# Patient Record
Sex: Male | Born: 1960 | Race: White | Hispanic: No | Smoking: Former smoker
Health system: Southern US, Community
[De-identification: ages and names within clinical notes are randomized; demographics above are authoritative.]

## PROBLEM LIST (undated history)

## (undated) DIAGNOSIS — E785 Hyperlipidemia, unspecified: Secondary | ICD-10-CM

## (undated) DIAGNOSIS — J449 Chronic obstructive pulmonary disease, unspecified: Secondary | ICD-10-CM

## (undated) DIAGNOSIS — I2510 Atherosclerotic heart disease of native coronary artery without angina pectoris: Secondary | ICD-10-CM

## (undated) HISTORY — DX: Chronic obstructive pulmonary disease, unspecified: J44.9

## (undated) HISTORY — PX: CHOLECYSTECTOMY: SHX55

## (undated) HISTORY — DX: Hyperlipidemia, unspecified: E78.5

---

## 2010-01-11 ENCOUNTER — Ambulatory Visit (HOSPITAL_BASED_OUTPATIENT_CLINIC_OR_DEPARTMENT_OTHER): Admission: RE | Admit: 2010-01-11 | Discharge: 2010-01-11 | Payer: Self-pay | Admitting: Specialist

## 2010-05-21 LAB — POCT HEMOGLOBIN-HEMACUE: Hemoglobin: 16 g/dL (ref 13.0–17.0)

## 2013-08-13 ENCOUNTER — Encounter (HOSPITAL_COMMUNITY): Payer: Self-pay | Admitting: Emergency Medicine

## 2013-08-13 ENCOUNTER — Emergency Department (HOSPITAL_COMMUNITY)
Admission: EM | Admit: 2013-08-13 | Discharge: 2013-08-13 | Disposition: A | Discharge status: Home / Self Care | Payer: 59 | Attending: Emergency Medicine | Admitting: Emergency Medicine

## 2013-08-13 DIAGNOSIS — Z9089 Acquired absence of other organs: Secondary | ICD-10-CM | POA: Insufficient documentation

## 2013-08-13 DIAGNOSIS — Z8619 Personal history of other infectious and parasitic diseases: Secondary | ICD-10-CM | POA: Insufficient documentation

## 2013-08-13 DIAGNOSIS — R1013 Epigastric pain: Secondary | ICD-10-CM | POA: Insufficient documentation

## 2013-08-13 DIAGNOSIS — R1110 Vomiting, unspecified: Secondary | ICD-10-CM | POA: Insufficient documentation

## 2013-08-13 LAB — URINALYSIS, ROUTINE W REFLEX MICROSCOPIC
Bilirubin Urine: NEGATIVE
Glucose, UA: NEGATIVE mg/dL
Hgb urine dipstick: NEGATIVE
Ketones, ur: >80 mg/dL — AB
Leukocytes, UA: NEGATIVE
Nitrite: NEGATIVE
Protein, ur: 30 mg/dL — AB
Specific Gravity, Urine: 1.03 (ref 1.005–1.030)
Urobilinogen, UA: 0.2 mg/dL (ref 0.0–1.0)
pH: 8.5 — ABNORMAL HIGH (ref 5.0–8.0)

## 2013-08-13 LAB — COMPREHENSIVE METABOLIC PANEL
ALT: 24 U/L (ref 0–53)
AST: 20 U/L (ref 0–37)
Albumin: 4.4 g/dL (ref 3.5–5.2)
Alkaline Phosphatase: 114 U/L (ref 39–117)
BUN: 18 mg/dL (ref 6–23)
CO2: 25 mEq/L (ref 19–32)
Calcium: 10 mg/dL (ref 8.4–10.5)
Chloride: 95 mEq/L — ABNORMAL LOW (ref 96–112)
Creatinine, Ser: 0.99 mg/dL (ref 0.50–1.35)
GFR calc Af Amer: >90 mL/min (ref >90)
GFR calc non Af Amer: >90 mL/min (ref >90)
Glucose, Bld: 129 mg/dL — ABNORMAL HIGH (ref 70–99)
Potassium: 3.6 mEq/L — ABNORMAL LOW (ref 3.7–5.3)
Sodium: 136 mEq/L — ABNORMAL LOW (ref 137–147)
Total Bilirubin: 0.8 mg/dL (ref 0.3–1.2)
Total Protein: 7.6 g/dL (ref 6.0–8.3)

## 2013-08-13 LAB — CBC WITH DIFFERENTIAL/PLATELET
Basophils Absolute: 0 10*3/uL (ref 0.0–0.1)
Basophils Relative: 0 % (ref 0–1)
Eosinophils Absolute: 0 10*3/uL (ref 0.0–0.7)
Eosinophils Relative: 0 % (ref 0–5)
HCT: 44.3 % (ref 39.0–52.0)
Hemoglobin: 16.4 g/dL (ref 13.0–17.0)
Lymphocytes Relative: 12 % (ref 12–46)
Lymphs Abs: 1.8 10*3/uL (ref 0.7–4.0)
MCH: 34.4 pg — ABNORMAL HIGH (ref 26.0–34.0)
MCHC: 37 g/dL — ABNORMAL HIGH (ref 30.0–36.0)
MCV: 92.9 fL (ref 78.0–100.0)
Monocytes Absolute: 1.3 10*3/uL — ABNORMAL HIGH (ref 0.1–1.0)
Monocytes Relative: 9 % (ref 3–12)
Neutro Abs: 12.5 10*3/uL — ABNORMAL HIGH (ref 1.7–7.7)
Neutrophils Relative %: 80 % — ABNORMAL HIGH (ref 43–77)
Platelets: 309 10*3/uL (ref 150–400)
RBC: 4.77 MIL/uL (ref 4.22–5.81)
RDW: 12.2 % (ref 11.5–15.5)
WBC: 15.7 10*3/uL — ABNORMAL HIGH (ref 4.0–10.5)

## 2013-08-13 LAB — LIPASE, BLOOD: Lipase: 25 U/L (ref 11–59)

## 2013-08-13 LAB — URINE MICROSCOPIC-ADD ON

## 2013-08-13 MED ORDER — ONDANSETRON HCL 4 MG/2ML IJ SOLN
4.0000 mg | Freq: Once | INTRAMUSCULAR | Status: AC
  Administered 2013-08-13: 4 mg via INTRAVENOUS
  Filled 2013-08-13: qty 2

## 2013-08-13 MED ORDER — RANITIDINE HCL 150 MG PO CAPS: 150.0000 mg | ORAL_CAPSULE | Freq: Every day | ORAL | Status: DC

## 2013-08-13 MED ORDER — SUCRALFATE 1 G PO TABS: 1.0000 g | ORAL_TABLET | Freq: Three times a day (TID) | ORAL | Status: DC

## 2013-08-13 MED ORDER — SUCRALFATE 1 G PO TABS
1.0000 g | ORAL_TABLET | Freq: Once | ORAL | Status: AC
Start: 2013-08-13 — End: 2013-08-13
  Administered 2013-08-13: 1 g via ORAL
  Filled 2013-08-13 (×2): qty 1

## 2013-08-13 MED ORDER — ONDANSETRON HCL 4 MG PO TABS: 4.0000 mg | ORAL_TABLET | Freq: Three times a day (TID) | ORAL | Status: DC | PRN

## 2013-08-13 MED ORDER — PANTOPRAZOLE SODIUM 40 MG IV SOLR
40.0000 mg | Freq: Once | INTRAVENOUS | Status: AC
  Administered 2013-08-13: 40 mg via INTRAVENOUS
  Filled 2013-08-13: qty 40

## 2013-08-13 MED ORDER — SODIUM CHLORIDE 0.9 % IV SOLN
INTRAVENOUS | Status: DC
  Administered 2013-08-13: 14:00:00 via INTRAVENOUS

## 2013-08-13 MED ORDER — SODIUM CHLORIDE 0.9 % IV BOLUS (SEPSIS)
1000.0000 mL | Freq: Once | INTRAVENOUS | Status: AC
  Administered 2013-08-13: 1000 mL via INTRAVENOUS

## 2013-08-13 MED ORDER — GI COCKTAIL ~~LOC~~
30.0000 mL | Freq: Once | ORAL | Status: AC
  Administered 2013-08-13: 30 mL via ORAL
  Filled 2013-08-13: qty 30

## 2013-08-13 NOTE — ED Notes (Signed)
Initial Contact - pt to [facility] with family, changed to hospital gown.  Pt reports c/o chills and nausea, reports hx of similar, being worked up by GI for same episodes.  Pt denies vomiting, denies pain, denies other complaints.  Pt appears uncomfortable.  Skin PWD.  A+Ox4.  MAEI.  NAD.

## 2013-08-13 NOTE — ED Notes (Signed)
Pt given crackers and water, tolerating well at this time.

## 2013-08-13 NOTE — ED Provider Notes (Signed)
CSN: [id number]     Arrival date & time 08/13/13  1210 History   First MD Initiated Contact with Patient 08/13/13 1243     Chief Complaint  Patient presents with  . Emesis     (Consider location/radiation/quality/duration/timing/severity/associated sxs/prior Treatment) HPI  53 year old male with prior surgical history of cholecystectomy who presents complaining of vomiting. Patient reports he has had recurrent symptoms of vomiting date as far back 6 years ago. At that time he was diagnosed with having H. pylori, receiving treatment and was symptom-free for 5 years. States for the past several days he was very nauseous and and sick to his stomach. Yesterday he started to vomit with nonbloody nonbilious vomitus and quantify the past 5-10 times since. Reports feeling subjective fever, chills, diaphoresis, body shakes, and weak. He has had symptoms like this in the past which usually lasting for about 4 or 5 days and resolved on its own. He has followup with 2 separate GI specialist and also had his gallbladder removed last November however his symptoms returned. He denies having any appetite. No complaints of headache, chest pain, shortness of breath, back pain, dysuria, or rash. Denies any strenuous exercise any recent trauma. No history of alcohol abuse or diabetes. No significant cardiac disease. Patient unable to keep anything down at this time. Last BM was yesterday and it was normal.  Able to pass flatus.  History reviewed. No pertinent past medical history. Past Surgical History  Procedure Laterality Date  . Cholecystectomy     No family history on file. History  Substance Use Topics  . Smoking status: Never Smoker   . Smokeless tobacco: Not on file  . Alcohol Use: No    Review of Systems  All other systems reviewed and are negative.     Allergies  Review of patient's allergies indicates not on file.  Home Medications   Prior to Admission medications   Not on File   BP  159/99  Pulse 90  Temp(Src) 97.8 F (36.6 C) (Oral)  Resp 18  SpO2 100% Physical Exam  Constitutional: He appears well-developed and well-nourished. No distress.  HENT:  Head: Atraumatic.  Mouth/Throat: Oropharynx is clear and moist.  Eyes: Conjunctivae are normal.  Neck: Normal range of motion. Neck supple.  Cardiovascular: Normal rate and regular rhythm.   Pulmonary/Chest: Effort normal and breath sounds normal. No respiratory distress. He has no wheezes. He has no rales. He exhibits no tenderness.  Abdominal: Soft. There is tenderness (Mild epigastric tenderness without guarding or rebound tenderness. No Murphy sign, no pain at McBurney's point, abdomen is nondistended with bowel sounds.).  Neurological: He is alert.  Skin: No rash noted.  Psychiatric: He has a normal mood and affect.    ED Course  Procedures (including critical care time)  1:04 PM Patient presents with recurrent bouts of cyclical vomitus but recurred every 4 months. He has a benign abdomen on exam. Is afebrile with stable normal vital signs. He does appears mildly uncomfortable an and when I palpate his epigastric region. Workup initiated.  2:34 PM Urine showed greater than 80 ketones to suggest dehydration. No evidence to suggest urinary tract infection. He has an elevated white count with a WBC of 15.7. This is nonspecific and likely a reflection of his current status. Given that he has no right low abnormal pain to suggest appendicitis and no left lower quadrant abdominal pain to suggest colitis and his lipase are normal therefore I have no strong indication to obtain advanced imaging  such as a CT scan of his abdomen. He has a nonsurgical abdomen. He has had prior cholecystectomy. Patient felt better after receiving antinausea medication and IV fluid. I will continue treating his symptoms but I suspect patient will be able to followup with his GI specialist outpatient for further management. Patient made aware of  plan. Care discussed with Dr. Fredderick PhenixBelfi.    3:35 PM Pt now able to eat crackers and drink.  Some sxs improvement but not back to normal. Pt amendable to f/u with his GI specialist for further care.  Pt other without c/o cp, sob, productive cough.    Labs Review Labs Reviewed  CBC WITH DIFFERENTIAL - Abnormal; Notable for the following:    WBC 15.7 (*)    MCH 34.4 (*)    MCHC 37.0 (*)    Neutrophils Relative % 80 (*)    Neutro Abs 12.5 (*)    Monocytes Absolute 1.3 (*)    All other components within normal limits  COMPREHENSIVE METABOLIC PANEL - Abnormal; Notable for the following:    Sodium 136 (*)    Potassium 3.6 (*)    Chloride 95 (*)    Glucose, Bld 129 (*)    All other components within normal limits  URINALYSIS, ROUTINE W REFLEX MICROSCOPIC - Abnormal; Notable for the following:    pH 8.5 (*)    Ketones, ur >80 (*)    Protein, ur 30 (*)    All other components within normal limits  LIPASE, BLOOD  URINE MICROSCOPIC-ADD ON    Imaging Review No results found.   EKG Interpretation None      MDM   Final diagnoses:  Emesis    BP 159/99  Pulse 90  Temp(Src) 97.8 F (36.6 C) (Oral)  Resp 18  SpO2 100%  I have reviewed nursing notes and vital signs.  I reviewed available ER/hospitalization records thought the EMR     Fayrene HelperBowie Shahzad Thomann, [place] JerseyPA-C 08/13/13 1537

## 2013-08-13 NOTE — ED Provider Notes (Signed)
Medical screening examination/treatment/procedure(s) were performed by non-physician practitioner and as supervising physician I was immediately available for consultation/collaboration.   EKG Interpretation None        Gergory Biello, MD 08/13/13 1606 

## 2013-08-13 NOTE — ED Notes (Signed)
Patient states that he has had these types of epidosed in the past and they are unsure what it is . The patient is having vomiting, shaking sick x 5 -6 days, this one stared about yesterday morning.

## 2013-08-13 NOTE — ED Notes (Signed)
Pt tolerated PO challenge, reports "no worse, but no better".  EDPA aware, rec'd verbal order to bolus current fluids and pt awaiting d/c.

## 2013-08-13 NOTE — Discharge Instructions (Signed)
Please follow up closely with your GI specialist for further evaluation and management of your symptoms.  Return if you develop fever, persistent vomiting or if you have other concerns.    Nausea and Vomiting Nausea is a sick feeling that often comes before throwing up (vomiting). Vomiting is a reflex where stomach contents come out of your mouth. Vomiting can cause severe loss of body fluids (dehydration). Children and elderly adults can become dehydrated quickly, especially if they also have diarrhea. Nausea and vomiting are symptoms of a condition or disease. It is important to find the cause of your symptoms. CAUSES   Direct irritation of the stomach lining. This irritation can result from increased acid production (gastroesophageal reflux disease), infection, food poisoning, taking certain medicines (such as nonsteroidal anti-inflammatory drugs), alcohol use, or tobacco use.  Signals from the brain.These signals could be caused by a headache, heat exposure, an inner ear disturbance, increased pressure in the brain from injury, infection, a tumor, or a concussion, pain, emotional stimulus, or metabolic problems.  An obstruction in the gastrointestinal tract (bowel obstruction).  Illnesses such as diabetes, hepatitis, gallbladder problems, appendicitis, kidney problems, cancer, sepsis, atypical symptoms of a heart attack, or eating disorders.  Medical treatments such as chemotherapy and radiation.  Receiving medicine that makes you sleep (general anesthetic) during surgery. DIAGNOSIS Your caregiver may ask for tests to be done if the problems do not improve after a few days. Tests may also be done if symptoms are severe or if the reason for the nausea and vomiting is not clear. Tests may include:  Urine tests.  Blood tests.  Stool tests.  Cultures (to look for evidence of infection).  X-rays or other imaging studies. Test results can help your caregiver make decisions about treatment  or the need for additional tests. TREATMENT You need to stay well hydrated. Drink frequently but in small amounts.You may wish to drink water, sports drinks, clear broth, or eat frozen ice pops or gelatin dessert to help stay hydrated.When you eat, eating slowly may help prevent nausea.There are also some antinausea medicines that may help prevent nausea. HOME CARE INSTRUCTIONS   Take all medicine as directed by your caregiver.  If you do not have an appetite, do not force yourself to eat. However, you must continue to drink fluids.  If you have an appetite, eat a normal diet unless your caregiver tells you differently.  Eat a variety of complex carbohydrates (rice, wheat, potatoes, bread), lean meats, yogurt, fruits, and vegetables.  Avoid high-fat foods because they are more difficult to digest.  Drink enough water and fluids to keep your urine clear or pale yellow.  If you are dehydrated, ask your caregiver for specific rehydration instructions. Signs of dehydration may include:  Severe thirst.  Dry lips and mouth.  Dizziness.  Dark urine.  Decreasing urine frequency and amount.  Confusion.  Rapid breathing or pulse. SEEK IMMEDIATE MEDICAL CARE IF:   You have blood or brown flecks (like coffee grounds) in your vomit.  You have black or bloody stools.  You have a severe headache or stiff neck.  You are confused.  You have severe abdominal pain.  You have chest pain or trouble breathing.  You do not urinate at least once every 8 hours.  You develop cold or clammy skin.  You continue to vomit for longer than 24 to 48 hours.  You have a fever. MAKE SURE YOU:   Understand these instructions.  Will watch your condition.  Will  get help right away if you are not doing well or get worse. Document Released: 02/24/2005 Document Revised: 05/19/2011 Document Reviewed: 07/24/2010 [facility] Patient Information 2014 [place], [place].

## 2020-04-13 ENCOUNTER — Ambulatory Visit: Payer: 59 | Admitting: Cardiology

## 2020-04-13 ENCOUNTER — Encounter: Payer: Self-pay | Admitting: Cardiology

## 2020-04-13 ENCOUNTER — Other Ambulatory Visit: Payer: Self-pay

## 2020-04-13 VITALS — BP 105/70 | HR 101 | Temp 98.7°F | Ht >= 80 in | Wt 173.2 lb

## 2020-04-13 DIAGNOSIS — E7800 Pure hypercholesterolemia, unspecified: Secondary | ICD-10-CM

## 2020-04-13 DIAGNOSIS — Z87891 Personal history of nicotine dependence: Secondary | ICD-10-CM

## 2020-04-13 DIAGNOSIS — J8410 Pulmonary fibrosis, unspecified: Secondary | ICD-10-CM

## 2020-04-13 DIAGNOSIS — R0600 Dyspnea, unspecified: Secondary | ICD-10-CM

## 2020-04-13 DIAGNOSIS — I447 Left bundle-branch block, unspecified: Secondary | ICD-10-CM

## 2020-04-13 DIAGNOSIS — R0609 Other forms of dyspnea: Secondary | ICD-10-CM

## 2020-04-13 DIAGNOSIS — I700 Atherosclerosis of aorta: Secondary | ICD-10-CM

## 2020-04-13 MED ORDER — ASPIRIN 81 MG PO CHEW: 81.0000 mg | CHEWABLE_TABLET | Freq: Every day | ORAL | Status: AC

## 2020-04-13 NOTE — Progress Notes (Signed)
Primary Physician/Referring:  Pieter Partridge, PA  Patient ID: Jermaine Morris, male    DOB: [birth year], 60 y.o.   MRN: [id number]  Chief Complaint  Patient presents with  . DOE   . Abnormal ECG  . New Patient (Initial Visit)    Referred by Adrienne Mocha, PA-C   HPI:    Jermaine Morris  is a 60 y.o. Caucasian male patient with COPD, interstitial lung disease by CXR 02/16/2020, GERD, referred to me for evaluation of dyspnea and abnormal EKG. past medical history significant for tobacco use disorder quit 3 months ago in September 2021, hyperlipidemia.  Patient has chronic dyspnea on exertion ongoing for several months and has gradually progressed. No PND or orthopnea. Denies any associated chest pain with exertion. Denies palpitations, dizziness or syncope.  Past Medical History:  Diagnosis Date  . COPD (chronic obstructive pulmonary disease) ([place])   . Hyperlipidemia    Past Surgical History:  Procedure Laterality Date  . CHOLECYSTECTOMY     Family History  Problem Relation Age of Onset  . Cancer Father   . Heart attack Brother     Social History   Tobacco Use  . Smoking status: Former Smoker    Packs/day: 1.50    Years: 30.00    Pack years: 45.00    Types: Cigarettes    Quit date: 12/2019    Years since quitting: 0.3  . Smokeless tobacco: Current User    Types: Snuff, Chew  . Tobacco comment: Oct 2021  Substance Use Topics  . Alcohol use: No   Marital Status: Married  ROS  Review of Systems  Cardiovascular: Positive for dyspnea on exertion. Negative for chest pain and leg swelling.  Respiratory: Positive for cough, sputum production and wheezing.   Gastrointestinal: Negative for melena.   Objective  Blood pressure 105/70, pulse (!) 101, temperature 98.7 F (37.1 C), temperature source Temporal, height 8' (2.438 m), weight 173 lb 3.2 oz (78.6 kg).  Vitals with BMI 04/13/2020 08/13/2013 08/13/2013  Height 8' 0"  - -  Weight 173 lbs 3 oz - -  BMI 82.80 - -  Systolic 034  917 915  Diastolic 70 87 99  Pulse 056 84 90     Physical Exam Constitutional:      Appearance: Normal appearance. He is normal weight.  Cardiovascular:     Rate and Rhythm: Normal rate and regular rhythm.     Pulses: Intact distal pulses.     Heart sounds: Normal heart sounds. No murmur heard. No gallop.      Comments: No leg edema, no JVD. Pulmonary:     Effort: Pulmonary effort is normal.     Breath sounds: Wheezing (scattered) and rales (bilateral bases fine crackles) present.  Abdominal:     General: Bowel sounds are normal.     Palpations: Abdomen is soft.    Laboratory examination:   No results for input(s): NA, K, CL, CO2, GLUCOSE, BUN, CREATININE, CALCIUM, GFRNONAA, GFRAA in the last 8760 hours. CrCl cannot be calculated (Patient's most recent lab result is older than the maximum 21 days allowed.).  CMP Latest Ref Rng & Units 08/13/2013  Glucose 70 - 99 mg/dL 129(H)  BUN 6 - 23 mg/dL 18  Creatinine 0.50 - 1.35 mg/dL 0.99  Sodium 137 - 147 mEq/L 136(L)  Potassium 3.7 - 5.3 mEq/L 3.6(L)  Chloride 96 - 112 mEq/L 95(L)  CO2 19 - 32 mEq/L 25  Calcium 8.4 - 10.5 mg/dL 10.0  Total Protein 6.0 -  8.3 g/dL 7.6  Total Bilirubin 0.3 - 1.2 mg/dL 0.8  Alkaline Phos 39 - 117 U/L 114  AST 0 - 37 U/L 20  ALT 0 - 53 U/L 24   CBC Latest Ref Rng & Units 08/13/2013 01/11/2010  WBC 4.0 - 10.5 K/uL 15.7(H) -  Hemoglobin 13.0 - 17.0 g/dL 16.4 16.0  Hematocrit 39.0 - 52.0 % 44.3 -  Platelets 150 - 400 K/uL 309 -    Lipid Panel No results for input(s): CHOL, TRIG, LDLCALC, VLDL, HDL, CHOLHDL, LDLDIRECT in the last 8760 hours.  HEMOGLOBIN A1C No results found for: HGBA1C, MPG TSH No results for input(s): TSH in the last 8760 hours.  External labs:   Labs 04/05/2020:   Total cholesterol 206, triglycerides 74, HDL 59, LDL 146.  Non-HDL cholesterol 147.  Sodium 137, potassium 4.6, BUN 11, creatinine 1.01, EGFR >60 mL.  TSH normal.  Medications and allergies   Allergies   Allergen Reactions  . Codeine Nausea And Vomiting     Outpatient Medications Prior to Visit  Medication Sig Dispense Refill  . ibuprofen (ADVIL,MOTRIN) 200 MG tablet Take 800 mg by mouth every 6 (six) hours as needed for moderate pain.    . rosuvastatin (CRESTOR) 10 MG tablet Take 10 mg by mouth daily. Patient unsure of dose    . omeprazole (PRILOSEC) 40 MG capsule Take 40 mg by mouth daily.    . ondansetron (ZOFRAN) 4 MG tablet Take 1 tablet (4 mg total) by mouth every 8 (eight) hours as needed for nausea or vomiting. 20 tablet 0  . ranitidine (ZANTAC) 150 MG capsule Take 1 capsule (150 mg total) by mouth daily. 30 capsule 0  . sucralfate (CARAFATE) 1 G tablet Take 1 tablet (1 g total) by mouth 4 (four) times daily -  with meals and at bedtime. 20 tablet 0   No facility-administered medications prior to visit.    Radiology:   CT of the abdomen 2015: Aortic atherosclerosis, no aneurysm.  Chest x-ray PA and lateral view 02/16/2020: Cardiovascular: Cardiac silhouette and pulmonary vasculature are within normal limits.  Mediastinum: Within normal limits.  Lungs/pleura: No pneumothorax or pleural effusion. Coarse interstitial and reticular opacities noted throughout both lungs. No consolidation or focal airspace opacities.  Cardiac Studies:   None EKG:     EKG 04/13/2020: Sinus tachycardia at rate of 103 bpm, biatrial enlargement, left bundle branch block.  PVCs (2).  No further analysis.  No significant change from 02/16/2020.   Assessment     ICD-10-CM   1. LBBB (left bundle branch block)  I44.7 EKG 12-Lead    PCV ECHOCARDIOGRAM COMPLETE    PCV MYOCARDIAL PERFUSION WITH LEXISCAN    CT CARDIAC SCORING ([facility] LOCATIONS ONLY)    CANCELED: CT CARDIAC SCORING (SELF PAY ONLY)  2. Dyspnea on exertion  R06.00 PCV ECHOCARDIOGRAM COMPLETE    PCV MYOCARDIAL PERFUSION WITH LEXISCAN  3. Pulmonary fibrosis ([place])  J84.10   4. Hypercholesteremia  E78.00   5. History of tobacco use Quit  12/2019. 45 pack year history.  Z87.891   6. Aortic atherosclerosis ([facility])  I70.0 aspirin (ASPIRIN CHILDRENS) 81 MG chewable tablet     Medications Discontinued During This Encounter  Medication Reason  . omeprazole (PRILOSEC) 40 MG capsule Error  . ondansetron (ZOFRAN) 4 MG tablet Error  . ranitidine (ZANTAC) 150 MG capsule Error  . sucralfate (CARAFATE) 1 G tablet Error    Meds ordered this encounter  Medications  . aspirin (ASPIRIN CHILDRENS) 81 MG chewable  tablet    Sig: Chew 1 tablet (81 mg total) by mouth daily.   Orders Placed This Encounter  Procedures  . CT CARDIAC SCORING ([facility] LOCATIONS ONLY)    $99 TOS PT IS AWARE   [id number]/NO NEEDS/wear mask/come alone/No to all covid q's///No spinal cord/No body injector/no glucose mon/abw/pt  Please remember if you need to cancel your appt, please do so 24 hours prior to your appointment to avoid getting charged a no-show fee of $75.00     NO heavy exercise   No caffeine 24 hours prior   No strenuous exercise 6 hours prior     Standing Status:   Future    Standing Expiration Date:   06/11/2020    Order Specific Question:   Preferred imaging location?    Answer:   [facility]  . PCV MYOCARDIAL PERFUSION WITH LEXISCAN    Standing Status:   Future    Standing Expiration Date:   06/11/2020  . EKG 12-Lead  . PCV ECHOCARDIOGRAM COMPLETE    Standing Status:   Future    Standing Expiration Date:   04/13/2021    Recommendations:   Slater Mcmanaman is a 60 y.o. Caucasian male patient with COPD, interstitial lung disease by CXR 02/16/2020, GERD, referred to me for evaluation of dyspnea and abnormal EKG. past medical history significant for tobacco use disorder quit 3 months ago in September 2021, hyperlipidemia.  Patient has chronic dyspnea on exertion ongoing for several months and has gradually progressed. No PND or orthopnea.  Am glad that he has quit smoking.  His physical examination is unremarkable except for abnormal lung exam.  His EKG  reveals underlying left bundle branch block.  He has recently started statins for hyperlipidemia and in view of abdominal aortic atherosclerosis noted on the CT scan almost 7 years ago, his goal LDL should be <70.  He is presently only 60 years of age.  If cardiac work-up is negative, would strongly consider pulmonary consultation specifically to evaluate for ILD and will need high-resolution CT for confirmation.  Schedule for a Lexiscan Nuclear stress test to evaluate for myocardial ischemia. Patient unable to do treadmill stress testing due to LBBB.  His underlying sinus tachycardia is also concerning but may be related to pulmonary issues as well.  Will schedule for an echocardiogram. Will obtain  coronary calcium score for further risk stratification. Office visit following the work-up/investigations. Patient instructed to start ASA  34m q daily for prophylaxis.  This was a 60-minute office visit encounter with evaluation of external records, discussion regarding primary and secondary prevention and discussions regarding effects of smoking and medication use.    JAdrian Prows MD, FCura[facility]2/07/2020, 10:27 AM Office: [phone]

## 2020-04-14 ENCOUNTER — Encounter: Payer: Self-pay | Admitting: Cardiology

## 2020-05-02 ENCOUNTER — Other Ambulatory Visit: Payer: Self-pay

## 2020-05-02 ENCOUNTER — Ambulatory Visit: Payer: 59

## 2020-05-02 DIAGNOSIS — R0609 Other forms of dyspnea: Secondary | ICD-10-CM

## 2020-05-02 DIAGNOSIS — I447 Left bundle-branch block, unspecified: Secondary | ICD-10-CM

## 2020-05-02 DIAGNOSIS — R0600 Dyspnea, unspecified: Secondary | ICD-10-CM

## 2020-05-04 ENCOUNTER — Other Ambulatory Visit: Payer: Self-pay

## 2020-05-04 ENCOUNTER — Ambulatory Visit: Payer: 59

## 2020-05-04 DIAGNOSIS — I447 Left bundle-branch block, unspecified: Secondary | ICD-10-CM

## 2020-05-04 DIAGNOSIS — R0609 Other forms of dyspnea: Secondary | ICD-10-CM

## 2020-05-04 DIAGNOSIS — R0600 Dyspnea, unspecified: Secondary | ICD-10-CM

## 2020-05-09 ENCOUNTER — Ambulatory Visit
Admission: RE | Admit: 2020-05-09 | Discharge: 2020-05-09 | Disposition: A | Discharge status: Home / Self Care | Payer: No Typology Code available for payment source | Source: Ambulatory Visit | Attending: Cardiology | Admitting: Cardiology

## 2020-05-09 DIAGNOSIS — I447 Left bundle-branch block, unspecified: Secondary | ICD-10-CM

## 2020-05-09 NOTE — Progress Notes (Signed)
CT CARDIAC CORONARY ARTERY CALCIUM SCORE 05/09/2020:  Left Main: 0 LAD: 623 LCx: 228 RCA: 3.4  Total Agatston Score: 854. MESA database percentile: 96.  Ascending Aorta: 28 mm. Descending Aorta: 24 mm.  Will discuss on OV, patient on statin

## 2020-05-18 ENCOUNTER — Encounter: Payer: Self-pay | Admitting: Cardiology

## 2020-05-18 ENCOUNTER — Ambulatory Visit: Payer: 59 | Admitting: Cardiology

## 2020-05-18 ENCOUNTER — Other Ambulatory Visit: Payer: Self-pay

## 2020-05-18 VITALS — BP 121/72 | HR 55 | Temp 98.0°F | Resp 16 | Ht >= 80 in | Wt 173.0 lb

## 2020-05-18 DIAGNOSIS — I700 Atherosclerosis of aorta: Secondary | ICD-10-CM

## 2020-05-18 DIAGNOSIS — R0600 Dyspnea, unspecified: Secondary | ICD-10-CM

## 2020-05-18 DIAGNOSIS — I420 Dilated cardiomyopathy: Secondary | ICD-10-CM

## 2020-05-18 DIAGNOSIS — R0609 Other forms of dyspnea: Secondary | ICD-10-CM

## 2020-05-18 DIAGNOSIS — I5022 Chronic systolic (congestive) heart failure: Secondary | ICD-10-CM

## 2020-05-18 DIAGNOSIS — E7800 Pure hypercholesterolemia, unspecified: Secondary | ICD-10-CM

## 2020-05-18 DIAGNOSIS — I447 Left bundle-branch block, unspecified: Secondary | ICD-10-CM

## 2020-05-18 NOTE — H&P (View-Only) (Signed)
Primary Physician/Referring:  Pieter Partridge, PA  Patient ID: Jermaine Morris, male    DOB: 09-Feb-1961, 60 y.o.   MRN: [id number]  Chief Complaint  Patient presents with  . Shortness of Breath  . Abnormal ECG  . Follow-up    6 weeks   HPI:    Jermaine Morris  is a 60 y.o. Caucasian male patient with COPD, interstitial lung disease by CXR 02/16/2020, GERD, referred to me for evaluation of dyspnea and abnormal EKG. past medical history significant for tobacco use disorder quit 3 months ago in September 2021, hyperlipidemia.   Patient was originally referred to our office to establish care with concerns of worsening dyspnea on exertion.  At last visit was recommended patient start aspirin 81 mg daily, which he is tolerating without issue.  Since last visit patient has undergone nuclear stress testing which was high risk and echocardiogram showing cardiomyopathy with LVEF of 15-20%.  Patient's coronary calcium score of 854 with some in the 46 percentile for age and sex.  Patient states overall he is feeling well, in fact has had improvement of dyspnea on exertion since last visit.  He denies chest pain, palpitations, syncope, near syncope, leg swelling, orthopnea, PND.  He does state he has occasional episodes of dizziness with positional changes.  Past Medical History:  Diagnosis Date  . COPD (chronic obstructive pulmonary disease) ([place])   . Hyperlipidemia    Past Surgical History:  Procedure Laterality Date  . CHOLECYSTECTOMY     Family History  Problem Relation Age of Onset  . Cancer Father   . Heart attack Brother     Social History   Tobacco Use  . Smoking status: Former Smoker    Packs/day: 1.50    Years: 30.00    Pack years: 45.00    Types: Cigarettes    Quit date: 12/2019    Years since quitting: 0.4  . Smokeless tobacco: Current User    Types: Snuff  . Tobacco comment: Oct 2021, recently quit Chew also  Substance Use Topics  . Alcohol use: No   Marital Status:  Married  ROS  Review of Systems  Constitutional: Negative for malaise/fatigue and weight gain.  Cardiovascular: Positive for dyspnea on exertion. Negative for chest pain, claudication, leg swelling, near-syncope, orthopnea, palpitations, paroxysmal nocturnal dyspnea and syncope.  Respiratory: Negative for cough, shortness of breath, sputum production and wheezing.   Hematologic/Lymphatic: Does not bruise/bleed easily.  Gastrointestinal: Negative for melena.  Neurological: Negative for dizziness and weakness.   Objective  Blood pressure 121/72, pulse (!) 55, temperature 98 F (36.7 C), temperature source Temporal, resp. rate 16, height 8' (2.438 m), weight 173 lb (78.5 kg), SpO2 98 %.  Vitals with BMI 05/18/2020 04/13/2020 08/13/2013  Height _0  _1  -  Weight 173 lbs 173 lbs 3 oz -  BMI 67.5 91.63 -  Systolic 846 659 935  Diastolic 72 70 87  Pulse 55 101 84     Physical Exam Vitals reviewed.  Constitutional:      Appearance: Normal appearance. He is normal weight.  HENT:     Head: Normocephalic and atraumatic.  Cardiovascular:     Rate and Rhythm: Normal rate and regular rhythm.     Pulses: Intact distal pulses.     Heart sounds: Normal heart sounds, S1 normal and S2 normal. No murmur heard. No gallop.      Comments: No leg edema, no JVD. Pulmonary:     Effort: Pulmonary effort is normal. No  respiratory distress.     Breath sounds: Wheezing (scattered) and rales (bilateral bases fine crackles) present. No rhonchi.  Abdominal:     General: Bowel sounds are normal.     Palpations: Abdomen is soft.  Musculoskeletal:     Right lower leg: No edema.     Left lower leg: No edema.  Skin:    General: Skin is warm and dry.  Neurological:     Mental Status: He is alert.    Laboratory examination:   No results for input(s): NA, K, CL, CO2, GLUCOSE, BUN, CREATININE, CALCIUM, GFRNONAA, GFRAA in the last 8760 hours. CrCl cannot be calculated (Patient's most recent lab result is  older than the maximum 21 days allowed.).  CMP Latest Ref Rng & Units 08/13/2013  Glucose 70 - 99 mg/dL 129(H)  BUN 6 - 23 mg/dL 18  Creatinine 0.50 - 1.35 mg/dL 0.99  Sodium 137 - 147 mEq/L 136(L)  Potassium 3.7 - 5.3 mEq/L 3.6(L)  Chloride 96 - 112 mEq/L 95(L)  CO2 19 - 32 mEq/L 25  Calcium 8.4 - 10.5 mg/dL 10.0  Total Protein 6.0 - 8.3 g/dL 7.6  Total Bilirubin 0.3 - 1.2 mg/dL 0.8  Alkaline Phos 39 - 117 U/L 114  AST 0 - 37 U/L 20  ALT 0 - 53 U/L 24   CBC Latest Ref Rng & Units 08/13/2013 01/11/2010  WBC 4.0 - 10.5 K/uL 15.7(H) -  Hemoglobin 13.0 - 17.0 g/dL 16.4 16.0  Hematocrit 39.0 - 52.0 % 44.3 -  Platelets 150 - 400 K/uL 309 -    Lipid Panel No results for input(s): CHOL, TRIG, LDLCALC, VLDL, HDL, CHOLHDL, LDLDIRECT in the last 8760 hours.  HEMOGLOBIN A1C No results found for: HGBA1C, MPG TSH No results for input(s): TSH in the last 8760 hours.  External labs:   Labs 04/05/2020:   Total cholesterol 206, triglycerides 74, HDL 59, LDL 146.  Non-HDL cholesterol 147.  Sodium 137, potassium 4.6, BUN 11, creatinine 1.01, EGFR >60 mL.  TSH normal.  Medications and allergies   Allergies  Allergen Reactions  . Codeine Nausea And Vomiting     Outpatient Medications Prior to Visit  Medication Sig Dispense Refill  . albuterol (VENTOLIN HFA) 108 (90 Base) MCG/ACT inhaler Inhale 1-2 puffs into the lungs every 6 (six) hours as needed for shortness of breath or wheezing.    Marland Kitchen aspirin (ASPIRIN CHILDRENS) 81 MG chewable tablet Chew 1 tablet (81 mg total) by mouth daily.    Marland Kitchen ibuprofen (ADVIL,MOTRIN) 200 MG tablet Take 400-800 mg by mouth every 6 (six) hours as needed for moderate pain.    . rosuvastatin (CRESTOR) 20 MG tablet Take 20 mg by mouth daily.     No facility-administered medications prior to visit.    Radiology:   CT of the abdomen 2015: Aortic atherosclerosis, no aneurysm.  Chest x-ray PA and lateral view 02/16/2020: Cardiovascular: Cardiac silhouette and  pulmonary vasculature are within normal limits.  Mediastinum: Within normal limits.  Lungs/pleura: No pneumothorax or pleural effusion. Coarse interstitial and reticular opacities noted throughout both lungs. No consolidation or focal airspace opacities.  Cardiac Studies:   CT cardiac scoring 05/09/2020: Left Main: 0 LAD: 623 LCx: 228 RCA: 3.4 Total Agatston Score: 854 MESA database percentile: 96 AORTA MEASUREMENTS: Ascending Aorta: 28 mm Descending Aorta: 24 mm OTHER FINDINGS: Heart is normal size. Aorta normal caliber. No adenopathy. No confluent opacities or effusions. Imaging into the upper abdomen demonstrates no acute findings. Chest wall soft tissues are unremarkable.  No acute bony abnormality.  IMPRESSION: Total Agatston score: [place] percentile: 96 No acute or significant extracardiac abnormality.  PCV ECHOCARDIOGRAM COMPLETE 05/04/2020 Left ventricle cavity is severely dilated. Normal left ventricular wall thickness. Severe global hypokinesis, LVEF 15-20%. Diastolic function not assessed due to severity of mitral regurgitation. Spontaneous echocontrast seen in LV cavity, without evidence of thrombus on this non-contrast study. Left atrial cavity is severely dilated. Severe posteriorly directed mitral regurgitation. Moderate tricuspid regurgitation. Estimated pulmonary artery systolic pressure 43 mmHg.   PCV MYOCARDIAL PERFUSION WITH LEXISCAN 05/02/2020 Lexiscan nuclear stress test performed using 1-day protocol. SPECT images show large LV cavity with global decrease in counts. In addition, there is medium sized, severe intensity, predominantly fixed perfusion defect in anterior apical myocardium. Global decrease in wall motion and thickening. Stress LVEF 13%. High risk study.  EKG:   EKG 04/13/2020: Sinus tachycardia at rate of 103 bpm, biatrial enlargement, left bundle branch block.  PVCs (2).  No further analysis.  No significant change from  02/16/2020.   Assessment     ICD-10-CM   1. Dyspnea on exertion  R06.00 CBC    Basic metabolic panel    Novel Coronavirus, NAA ([facility])  2. Dilated cardiomyopathy ([place])  I42.0   3. Chronic systolic heart failure ([facility])  I50.22   4. Hypercholesteremia  E78.00   5. Aortic atherosclerosis ([facility])  I70.0   6. LBBB (left bundle branch block)  I44.7      There are no discontinued medications.  No orders of the defined types were placed in this encounter.  Orders Placed This Encounter  Procedures  . Novel Coronavirus, NAA ([facility])    Order Specific Question:   Is this test for diagnosis or screening    Answer:   Screening    Order Specific Question:   Symptomatic for COVID-19 as defined by CDC    Answer:   No    Order Specific Question:   Hospitalized for COVID-19    Answer:   No    Order Specific Question:   Admitted to ICU for COVID-19    Answer:   No    Order Specific Question:   Resident in a congregate (group) care setting    Answer:   No    Order Specific Question:   Is the patient student?    Answer:   No    Order Specific Question:   Employed in healthcare setting    Answer:   No    Order Specific Question:   Has patient completed COVID vaccination(s) (2 doses of Pfizer/Moderna 1 dose of [employer])    Answer:   No    Order Specific Question:   Previously tested for COVID-19    Answer:   No  . CBC  . Basic metabolic panel    Recommendations:   Jermaine Morris is a 60 y.o. Caucasian male patient with COPD, interstitial lung disease by CXR 02/16/2020, GERD, referred to me for evaluation of dyspnea and abnormal EKG. past medical history significant for tobacco use disorder quit 3 months ago in September 2021, hyperlipidemia.  Patient was referred to our office for evaluation of worsening dyspnea on exertion.  Cardiac testing revealed cardiomyopathy with markedly reduced LVEF at 15-20%.  There presently no clinical signs of heart failure and patient is feeling well  overall, in fact reports improvement of dyspnea.  In view of results of echocardiogram, coronary calcium score, and nuclear stress test, details above, recommend proceeding with left and right heart  cardiac catheterization to evaluate for underlying ischemic etiology.  The left and right heart catheterization procedure was explained to the patient in detail. The indication, alternatives, risks and benefits were reviewed. Complications including but not limited to bleeding, infection, acute kidney injury, blood transfusion, heart rhythm disturbances, contrast (dye) reaction, damage to the arteries or nerves in the legs or hands, cerebrovascular accident, myocardial infarction, need for emergent bypass surgery, blood clots in the legs, possible need for emergent blood transfusion, and rarely death were reviewed and discussed with the patient. The patient voices understanding and wishes to proceed.  Also discussed with patient regarding recommended guideline directed medical therapy, particularly initiating beta-blocker therapy.  Patient verbalized understanding of the benefits, however he prefers to first do independent research regarding the medication benefits and potential side effect profile.  Patient will notify our office if he is willing to proceed with initiation of beta-blocker therapy.  However he states he likely prefers to wait until after the cardiac catheterization.  Notably patient's blood pressure is well controlled, and he does have episodes suggestive of orthostasis.  We will schedule for left and right heart catheterization and plan to follow-up with him following the procedure.  Follow-up in 4 weeks, sooner if needed, for cardiomyopathy and systolic heart failure.  Patient was seen in collaboration with Dr. Einar Gip. He also reviewed patient's chart and Dr. Einar Gip is in agreement of the plan.    Alethia Berthold, PA-C 05/18/2020, 3:35 PM Office: [phone]    Patient has chronic  dyspnea on exertion ongoing for several months and has gradually progressed. No PND or orthopnea.  Am glad that he has quit smoking.  His physical examination is unremarkable except for abnormal lung exam.  His EKG reveals underlying left bundle branch block.  He has recently started statins for hyperlipidemia and in view of abdominal aortic atherosclerosis noted on the CT scan almost 7 years ago, his goal LDL should be <70.  He is presently only 60 years of age.  If cardiac work-up is negative, would strongly consider pulmonary consultation specifically to evaluate for ILD and will need high-resolution CT for confirmation.  Schedule for a Lexiscan Nuclear stress test to evaluate for myocardial ischemia. Patient unable to do treadmill stress testing due to LBBB.  His underlying sinus tachycardia is also concerning but may be related to pulmonary issues as well.  Will schedule for an echocardiogram. Will obtain  coronary calcium score for further risk stratification. Office visit following the work-up/investigations. Patient instructed to start ASA  8m q daily for prophylaxis.  This was a 60-minute office visit encounter with evaluation of external records, discussion regarding primary and secondary prevention and discussions regarding effects of smoking and medication use.    CAlethia Berthold MD, FAffiliated Endoscopy Services Of Clifton3/01/2021, 3:35 PM Office: [phone]

## 2020-05-18 NOTE — Progress Notes (Signed)
Primary Physician/Referring:  Pieter Partridge, PA  Patient ID: Jermaine Morris, male    DOB: 09-Feb-1961, 60 y.o.   MRN: [id number]  Chief Complaint  Patient presents with  . Shortness of Breath  . Abnormal ECG  . Follow-up    6 weeks   HPI:    Jermaine Morris  is a 60 y.o. Caucasian male patient with COPD, interstitial lung disease by CXR 02/16/2020, GERD, referred to me for evaluation of dyspnea and abnormal EKG. past medical history significant for tobacco use disorder quit 3 months ago in September 2021, hyperlipidemia.   Patient was originally referred to our office to establish care with concerns of worsening dyspnea on exertion.  At last visit was recommended patient start aspirin 81 mg daily, which he is tolerating without issue.  Since last visit patient has undergone nuclear stress testing which was high risk and echocardiogram showing cardiomyopathy with LVEF of 15-20%.  Patient's coronary calcium score of 854 with some in the 46 percentile for age and sex.  Patient states overall he is feeling well, in fact has had improvement of dyspnea on exertion since last visit.  He denies chest pain, palpitations, syncope, near syncope, leg swelling, orthopnea, PND.  He does state he has occasional episodes of dizziness with positional changes.  Past Medical History:  Diagnosis Date  . COPD (chronic obstructive pulmonary disease) ([place])   . Hyperlipidemia    Past Surgical History:  Procedure Laterality Date  . CHOLECYSTECTOMY     Family History  Problem Relation Age of Onset  . Cancer Father   . Heart attack Brother     Social History   Tobacco Use  . Smoking status: Former Smoker    Packs/day: 1.50    Years: 30.00    Pack years: 45.00    Types: Cigarettes    Quit date: 12/2019    Years since quitting: 0.4  . Smokeless tobacco: Current User    Types: Snuff  . Tobacco comment: Oct 2021, recently quit Chew also  Substance Use Topics  . Alcohol use: No   Marital Status:  Married  ROS  Review of Systems  Constitutional: Negative for malaise/fatigue and weight gain.  Cardiovascular: Positive for dyspnea on exertion. Negative for chest pain, claudication, leg swelling, near-syncope, orthopnea, palpitations, paroxysmal nocturnal dyspnea and syncope.  Respiratory: Negative for cough, shortness of breath, sputum production and wheezing.   Hematologic/Lymphatic: Does not bruise/bleed easily.  Gastrointestinal: Negative for melena.  Neurological: Negative for dizziness and weakness.   Objective  Blood pressure 121/72, pulse (!) 55, temperature 98 F (36.7 C), temperature source Temporal, resp. rate 16, height 8' (2.438 m), weight 173 lb (78.5 kg), SpO2 98 %.  Vitals with BMI 05/18/2020 04/13/2020 08/13/2013  Height _0  _1  -  Weight 173 lbs 173 lbs 3 oz -  BMI 67.5 91.63 -  Systolic 846 659 935  Diastolic 72 70 87  Pulse 55 101 84     Physical Exam Vitals reviewed.  Constitutional:      Appearance: Normal appearance. He is normal weight.  HENT:     Head: Normocephalic and atraumatic.  Cardiovascular:     Rate and Rhythm: Normal rate and regular rhythm.     Pulses: Intact distal pulses.     Heart sounds: Normal heart sounds, S1 normal and S2 normal. No murmur heard. No gallop.      Comments: No leg edema, no JVD. Pulmonary:     Effort: Pulmonary effort is normal. No  respiratory distress.     Breath sounds: Wheezing (scattered) and rales (bilateral bases fine crackles) present. No rhonchi.  Abdominal:     General: Bowel sounds are normal.     Palpations: Abdomen is soft.  Musculoskeletal:     Right lower leg: No edema.     Left lower leg: No edema.  Skin:    General: Skin is warm and dry.  Neurological:     Mental Status: He is alert.    Laboratory examination:   No results for input(s): NA, K, CL, CO2, GLUCOSE, BUN, CREATININE, CALCIUM, GFRNONAA, GFRAA in the last 8760 hours. CrCl cannot be calculated (Patient's most recent lab result is  older than the maximum 21 days allowed.).  CMP Latest Ref Rng & Units 08/13/2013  Glucose 70 - 99 mg/dL 129(H)  BUN 6 - 23 mg/dL 18  Creatinine 0.50 - 1.35 mg/dL 0.99  Sodium 137 - 147 mEq/L 136(L)  Potassium 3.7 - 5.3 mEq/L 3.6(L)  Chloride 96 - 112 mEq/L 95(L)  CO2 19 - 32 mEq/L 25  Calcium 8.4 - 10.5 mg/dL 10.0  Total Protein 6.0 - 8.3 g/dL 7.6  Total Bilirubin 0.3 - 1.2 mg/dL 0.8  Alkaline Phos 39 - 117 U/L 114  AST 0 - 37 U/L 20  ALT 0 - 53 U/L 24   CBC Latest Ref Rng & Units 08/13/2013 01/11/2010  WBC 4.0 - 10.5 K/uL 15.7(H) -  Hemoglobin 13.0 - 17.0 g/dL 16.4 16.0  Hematocrit 39.0 - 52.0 % 44.3 -  Platelets 150 - 400 K/uL 309 -    Lipid Panel No results for input(s): CHOL, TRIG, LDLCALC, VLDL, HDL, CHOLHDL, LDLDIRECT in the last 8760 hours.  HEMOGLOBIN A1C No results found for: HGBA1C, MPG TSH No results for input(s): TSH in the last 8760 hours.  External labs:   Labs 04/05/2020:   Total cholesterol 206, triglycerides 74, HDL 59, LDL 146.  Non-HDL cholesterol 147.  Sodium 137, potassium 4.6, BUN 11, creatinine 1.01, EGFR >60 mL.  TSH normal.  Medications and allergies   Allergies  Allergen Reactions  . Codeine Nausea And Vomiting     Outpatient Medications Prior to Visit  Medication Sig Dispense Refill  . albuterol (VENTOLIN HFA) 108 (90 Base) MCG/ACT inhaler Inhale 1-2 puffs into the lungs every 6 (six) hours as needed for shortness of breath or wheezing.    Marland Kitchen aspirin (ASPIRIN CHILDRENS) 81 MG chewable tablet Chew 1 tablet (81 mg total) by mouth daily.    Marland Kitchen ibuprofen (ADVIL,MOTRIN) 200 MG tablet Take 400-800 mg by mouth every 6 (six) hours as needed for moderate pain.    . rosuvastatin (CRESTOR) 20 MG tablet Take 20 mg by mouth daily.     No facility-administered medications prior to visit.    Radiology:   CT of the abdomen 2015: Aortic atherosclerosis, no aneurysm.  Chest x-ray PA and lateral view 02/16/2020: Cardiovascular: Cardiac silhouette and  pulmonary vasculature are within normal limits.  Mediastinum: Within normal limits.  Lungs/pleura: No pneumothorax or pleural effusion. Coarse interstitial and reticular opacities noted throughout both lungs. No consolidation or focal airspace opacities.  Cardiac Studies:   CT cardiac scoring 05/09/2020: Left Main: 0 LAD: 623 LCx: 228 RCA: 3.4 Total Agatston Score: 854 MESA database percentile: 96 AORTA MEASUREMENTS: Ascending Aorta: 28 mm Descending Aorta: 24 mm OTHER FINDINGS: Heart is normal size. Aorta normal caliber. No adenopathy. No confluent opacities or effusions. Imaging into the upper abdomen demonstrates no acute findings. Chest wall soft tissues are unremarkable.  No acute bony abnormality.  IMPRESSION: Total Agatston score: [place] percentile: 96 No acute or significant extracardiac abnormality.  PCV ECHOCARDIOGRAM COMPLETE 05/04/2020 Left ventricle cavity is severely dilated. Normal left ventricular wall thickness. Severe global hypokinesis, LVEF 15-20%. Diastolic function not assessed due to severity of mitral regurgitation. Spontaneous echocontrast seen in LV cavity, without evidence of thrombus on this non-contrast study. Left atrial cavity is severely dilated. Severe posteriorly directed mitral regurgitation. Moderate tricuspid regurgitation. Estimated pulmonary artery systolic pressure 43 mmHg.   PCV MYOCARDIAL PERFUSION WITH LEXISCAN 05/02/2020 Lexiscan nuclear stress test performed using 1-day protocol. SPECT images show large LV cavity with global decrease in counts. In addition, there is medium sized, severe intensity, predominantly fixed perfusion defect in anterior apical myocardium. Global decrease in wall motion and thickening. Stress LVEF 13%. High risk study.  EKG:   EKG 04/13/2020: Sinus tachycardia at rate of 103 bpm, biatrial enlargement, left bundle branch block.  PVCs (2).  No further analysis.  No significant change from  02/16/2020.   Assessment     ICD-10-CM   1. Dyspnea on exertion  R06.00 CBC    Basic metabolic panel    Novel Coronavirus, NAA ([facility])  2. Dilated cardiomyopathy ([place])  I42.0   3. Chronic systolic heart failure (H[facility])  I50.22   4. Hypercholesteremia  E78.00   5. Aortic atherosclerosis ([facility])  I70.0   6. LBBB (left bundle branch block)  I44.7      There are no discontinued medications.  No orders of the defined types were placed in this encounter.  Orders Placed This Encounter  Procedures  . Novel Coronavirus, NAA ([facility])    Order Specific Question:   Is this test for diagnosis or screening    Answer:   Screening    Order Specific Question:   Symptomatic for COVID-19 as defined by CDC    Answer:   No    Order Specific Question:   Hospitalized for COVID-19    Answer:   No    Order Specific Question:   Admitted to ICU for COVID-19    Answer:   No    Order Specific Question:   Resident in a congregate (group) care setting    Answer:   No    Order Specific Question:   Is the patient student?    Answer:   No    Order Specific Question:   Employed in healthcare setting    Answer:   No    Order Specific Question:   Has patient completed COVID vaccination(s) (2 doses of Pfizer/Moderna 1 dose of [employer])    Answer:   No    Order Specific Question:   Previously tested for COVID-19    Answer:   No  . CBC  . Basic metabolic panel    Recommendations:   Jermaine Morris is a 60 y.o. Caucasian male patient with COPD, interstitial lung disease by CXR 02/16/2020, GERD, referred to me for evaluation of dyspnea and abnormal EKG. past medical history significant for tobacco use disorder quit 3 months ago in September 2021, hyperlipidemia.  Patient was referred to our office for evaluation of worsening dyspnea on exertion.  Cardiac testing revealed cardiomyopathy with markedly reduced LVEF at 15-20%.  There presently no clinical signs of heart failure and patient is feeling well  overall, in fact reports improvement of dyspnea.  In view of results of echocardiogram, coronary calcium score, and nuclear stress test, details above, recommend proceeding with left and right heart  cardiac catheterization to evaluate for underlying ischemic etiology.  The left and right heart catheterization procedure was explained to the patient in detail. The indication, alternatives, risks and benefits were reviewed. Complications including but not limited to bleeding, infection, acute kidney injury, blood transfusion, heart rhythm disturbances, contrast (dye) reaction, damage to the arteries or nerves in the legs or hands, cerebrovascular accident, myocardial infarction, need for emergent bypass surgery, blood clots in the legs, possible need for emergent blood transfusion, and rarely death were reviewed and discussed with the patient. The patient voices understanding and wishes to proceed.  Also discussed with patient regarding recommended guideline directed medical therapy, particularly initiating beta-blocker therapy.  Patient verbalized understanding of the benefits, however he prefers to first do independent research regarding the medication benefits and potential side effect profile.  Patient will notify our office if he is willing to proceed with initiation of beta-blocker therapy.  However he states he likely prefers to wait until after the cardiac catheterization.  Notably patient's blood pressure is well controlled, and he does have episodes suggestive of orthostasis.  We will schedule for left and right heart catheterization and plan to follow-up with him following the procedure.  Follow-up in 4 weeks, sooner if needed, for cardiomyopathy and systolic heart failure.  Patient was seen in collaboration with Dr. Einar Gip. He also reviewed patient's chart and Dr. Einar Gip is in agreement of the plan.    Alethia Berthold, PA-C 05/18/2020, 3:35 PM Office: [phone]    Patient has chronic  dyspnea on exertion ongoing for several months and has gradually progressed. No PND or orthopnea.  Am glad that he has quit smoking.  His physical examination is unremarkable except for abnormal lung exam.  His EKG reveals underlying left bundle branch block.  He has recently started statins for hyperlipidemia and in view of abdominal aortic atherosclerosis noted on the CT scan almost 7 years ago, his goal LDL should be <70.  He is presently only 60 years of age.  If cardiac work-up is negative, would strongly consider pulmonary consultation specifically to evaluate for ILD and will need high-resolution CT for confirmation.  Schedule for a Lexiscan Nuclear stress test to evaluate for myocardial ischemia. Patient unable to do treadmill stress testing due to LBBB.  His underlying sinus tachycardia is also concerning but may be related to pulmonary issues as well.  Will schedule for an echocardiogram. Will obtain  coronary calcium score for further risk stratification. Office visit following the work-up/investigations. Patient instructed to start ASA  8m q daily for prophylaxis.  This was a 60-minute office visit encounter with evaluation of external records, discussion regarding primary and secondary prevention and discussions regarding effects of smoking and medication use.    CAlethia Berthold MD, FAffiliated Endoscopy Services Of Clifton3/01/2021, 3:35 PM Office: [phone]

## 2020-05-23 ENCOUNTER — Other Ambulatory Visit: Payer: Self-pay | Admitting: Student

## 2020-05-24 LAB — BASIC METABOLIC PANEL
BUN/Creatinine Ratio: 13 (ref 9–20)
BUN: 14 mg/dL (ref 6–24)
CO2: 23 mmol/L (ref 20–29)
Calcium: 9.1 mg/dL (ref 8.7–10.2)
Chloride: 100 mmol/L (ref 96–106)
Creatinine, Ser: 1.05 mg/dL (ref 0.76–1.27)
Glucose: 116 mg/dL — ABNORMAL HIGH (ref 65–99)
Potassium: 5.1 mmol/L (ref 3.5–5.2)
Sodium: 140 mmol/L (ref 134–144)
eGFR: 82 mL/min/{1.73_m2} (ref >59)

## 2020-05-24 LAB — CBC
Hematocrit: 44.7 % (ref 37.5–51.0)
Hemoglobin: 15.3 g/dL (ref 13.0–17.7)
MCH: 34.5 pg — ABNORMAL HIGH (ref 26.6–33.0)
MCHC: 34.2 g/dL (ref 31.5–35.7)
MCV: 101 fL — ABNORMAL HIGH (ref 79–97)
Platelets: 261 10*3/uL (ref 150–450)
RBC: 4.44 x10E6/uL (ref 4.14–5.80)
RDW: 12.1 % (ref 11.6–15.4)
WBC: 7 10*3/uL (ref 3.4–10.8)

## 2020-05-25 ENCOUNTER — Other Ambulatory Visit (HOSPITAL_COMMUNITY)
Admission: RE | Admit: 2020-05-25 | Discharge: 2020-05-25 | Disposition: A | Discharge status: Home / Self Care | Payer: 59 | Source: Ambulatory Visit | Attending: Cardiology | Admitting: Cardiology

## 2020-05-25 DIAGNOSIS — Z01812 Encounter for preprocedural laboratory examination: Secondary | ICD-10-CM | POA: Insufficient documentation

## 2020-05-25 DIAGNOSIS — Z20822 Contact with and (suspected) exposure to covid-19: Secondary | ICD-10-CM | POA: Insufficient documentation

## 2020-05-25 LAB — SARS CORONAVIRUS 2 (TAT 6-24 HRS): SARS Coronavirus 2: NEGATIVE

## 2020-05-28 DIAGNOSIS — R0609 Other forms of dyspnea: Secondary | ICD-10-CM | POA: Diagnosis present

## 2020-05-28 DIAGNOSIS — R9439 Abnormal result of other cardiovascular function study: Secondary | ICD-10-CM | POA: Diagnosis present

## 2020-05-28 DIAGNOSIS — R0600 Dyspnea, unspecified: Secondary | ICD-10-CM | POA: Diagnosis present

## 2020-05-29 ENCOUNTER — Other Ambulatory Visit: Payer: Self-pay

## 2020-05-29 ENCOUNTER — Inpatient Hospital Stay (HOSPITAL_COMMUNITY)
Admission: AD | Admit: 2020-05-29 | Discharge: 2020-06-02 | DRG: 246 | Disposition: A | Discharge status: Home / Self Care | Payer: 59 | Attending: Cardiology | Admitting: Cardiology

## 2020-05-29 ENCOUNTER — Inpatient Hospital Stay (HOSPITAL_COMMUNITY): Payer: 59

## 2020-05-29 ENCOUNTER — Inpatient Hospital Stay (HOSPITAL_COMMUNITY)
Admission: AD | Disposition: A | Discharge status: Home / Self Care | Payer: Self-pay | Source: Home / Self Care | Attending: Cardiology

## 2020-05-29 ENCOUNTER — Telehealth: Payer: Self-pay

## 2020-05-29 DIAGNOSIS — Z720 Tobacco use: Secondary | ICD-10-CM

## 2020-05-29 DIAGNOSIS — J449 Chronic obstructive pulmonary disease, unspecified: Secondary | ICD-10-CM | POA: Diagnosis present

## 2020-05-29 DIAGNOSIS — E785 Hyperlipidemia, unspecified: Secondary | ICD-10-CM | POA: Diagnosis present

## 2020-05-29 DIAGNOSIS — I700 Atherosclerosis of aorta: Secondary | ICD-10-CM | POA: Diagnosis present

## 2020-05-29 DIAGNOSIS — I081 Rheumatic disorders of both mitral and tricuspid valves: Secondary | ICD-10-CM | POA: Diagnosis present

## 2020-05-29 DIAGNOSIS — I25110 Atherosclerotic heart disease of native coronary artery with unstable angina pectoris: Secondary | ICD-10-CM | POA: Diagnosis present

## 2020-05-29 DIAGNOSIS — R0600 Dyspnea, unspecified: Secondary | ICD-10-CM | POA: Diagnosis present

## 2020-05-29 DIAGNOSIS — K219 Gastro-esophageal reflux disease without esophagitis: Secondary | ICD-10-CM | POA: Diagnosis present

## 2020-05-29 DIAGNOSIS — I420 Dilated cardiomyopathy: Secondary | ICD-10-CM | POA: Diagnosis present

## 2020-05-29 DIAGNOSIS — Z7982 Long term (current) use of aspirin: Secondary | ICD-10-CM

## 2020-05-29 DIAGNOSIS — I110 Hypertensive heart disease with heart failure: Principal | ICD-10-CM | POA: Diagnosis present

## 2020-05-29 DIAGNOSIS — I2582 Chronic total occlusion of coronary artery: Secondary | ICD-10-CM | POA: Diagnosis present

## 2020-05-29 DIAGNOSIS — Z8249 Family history of ischemic heart disease and other diseases of the circulatory system: Secondary | ICD-10-CM | POA: Diagnosis not present

## 2020-05-29 DIAGNOSIS — I509 Heart failure, unspecified: Secondary | ICD-10-CM

## 2020-05-29 DIAGNOSIS — Z79899 Other long term (current) drug therapy: Secondary | ICD-10-CM

## 2020-05-29 DIAGNOSIS — I5023 Acute on chronic systolic (congestive) heart failure: Secondary | ICD-10-CM | POA: Diagnosis present

## 2020-05-29 DIAGNOSIS — I255 Ischemic cardiomyopathy: Secondary | ICD-10-CM

## 2020-05-29 DIAGNOSIS — J849 Interstitial pulmonary disease, unspecified: Secondary | ICD-10-CM | POA: Diagnosis present

## 2020-05-29 DIAGNOSIS — I2510 Atherosclerotic heart disease of native coronary artery without angina pectoris: Secondary | ICD-10-CM

## 2020-05-29 DIAGNOSIS — R0609 Other forms of dyspnea: Secondary | ICD-10-CM | POA: Diagnosis present

## 2020-05-29 DIAGNOSIS — Z955 Presence of coronary angioplasty implant and graft: Secondary | ICD-10-CM

## 2020-05-29 DIAGNOSIS — R9439 Abnormal result of other cardiovascular function study: Secondary | ICD-10-CM | POA: Diagnosis present

## 2020-05-29 HISTORY — PX: RIGHT/LEFT HEART CATH AND CORONARY ANGIOGRAPHY: CATH118266

## 2020-05-29 HISTORY — DX: Atherosclerotic heart disease of native coronary artery without angina pectoris: I25.10

## 2020-05-29 LAB — CBC
HCT: 46.3 % (ref 39.0–52.0)
Hemoglobin: 16.6 g/dL (ref 13.0–17.0)
MCH: 34.7 pg — ABNORMAL HIGH (ref 26.0–34.0)
MCHC: 35.9 g/dL (ref 30.0–36.0)
MCV: 96.7 fL (ref 80.0–100.0)
Platelets: 274 10*3/uL (ref 150–400)
RBC: 4.79 MIL/uL (ref 4.22–5.81)
RDW: 12 % (ref 11.5–15.5)
WBC: 10.9 10*3/uL — ABNORMAL HIGH (ref 4.0–10.5)
nRBC: 0 % (ref 0.0–0.2)

## 2020-05-29 LAB — POCT I-STAT EG7
Acid-base deficit: 1 mmol/L (ref 0.0–2.0)
Acid-base deficit: 1 mmol/L (ref 0.0–2.0)
Bicarbonate: 27.8 mmol/L (ref 20.0–28.0)
Bicarbonate: 28.2 mmol/L — ABNORMAL HIGH (ref 20.0–28.0)
Calcium, Ion: 1.25 mmol/L (ref 1.15–1.40)
Calcium, Ion: 1.25 mmol/L (ref 1.15–1.40)
HCT: 46 % (ref 39.0–52.0)
HCT: 46 % (ref 39.0–52.0)
Hemoglobin: 15.6 g/dL (ref 13.0–17.0)
Hemoglobin: 15.6 g/dL (ref 13.0–17.0)
O2 Saturation: 68 %
O2 Saturation: 68 %
Potassium: 4.3 mmol/L (ref 3.5–5.1)
Potassium: 4.3 mmol/L (ref 3.5–5.1)
Sodium: 139 mmol/L (ref 135–145)
Sodium: 139 mmol/L (ref 135–145)
TCO2: 30 mmol/L (ref 22–32)
TCO2: 30 mmol/L (ref 22–32)
pCO2, Ven: 64.9 mmHg — ABNORMAL HIGH (ref 44.0–60.0)
pCO2, Ven: 66 mmHg — ABNORMAL HIGH (ref 44.0–60.0)
pH, Ven: 7.24 — ABNORMAL LOW (ref 7.250–7.430)
pH, Ven: 7.239 — ABNORMAL LOW (ref 7.250–7.430)
pO2, Ven: 42 mmHg (ref 32.0–45.0)
pO2, Ven: 43 mmHg (ref 32.0–45.0)

## 2020-05-29 LAB — CREATININE, SERUM
Creatinine, Ser: 1.16 mg/dL (ref 0.61–1.24)
GFR, Estimated: >60 mL/min (ref >60)

## 2020-05-29 LAB — POCT I-STAT 7, (LYTES, BLD GAS, ICA,H+H)
Acid-base deficit: 2 mmol/L (ref 0.0–2.0)
Bicarbonate: 26.7 mmol/L (ref 20.0–28.0)
Calcium, Ion: 1.2 mmol/L (ref 1.15–1.40)
HCT: 45 % (ref 39.0–52.0)
Hemoglobin: 15.3 g/dL (ref 13.0–17.0)
O2 Saturation: 98 %
Potassium: 4.2 mmol/L (ref 3.5–5.1)
Sodium: 139 mmol/L (ref 135–145)
TCO2: 29 mmol/L (ref 22–32)
pCO2 arterial: 59.7 mmHg — ABNORMAL HIGH (ref 32.0–48.0)
pH, Arterial: 7.259 — ABNORMAL LOW (ref 7.350–7.450)
pO2, Arterial: 122 mmHg — ABNORMAL HIGH (ref 83.0–108.0)

## 2020-05-29 SURGERY — RIGHT/LEFT HEART CATH AND CORONARY ANGIOGRAPHY
Anesthesia: LOCAL

## 2020-05-29 MED ORDER — PANTOPRAZOLE SODIUM 40 MG PO TBEC
40.0000 mg | DELAYED_RELEASE_TABLET | Freq: Once | ORAL | Status: AC
  Administered 2020-05-29: 40 mg via ORAL
  Filled 2020-05-29: qty 1

## 2020-05-29 MED ORDER — SODIUM CHLORIDE 0.9 % WEIGHT BASED INFUSION
3.0000 mL/kg/h | INTRAVENOUS | Status: DC
  Administered 2020-05-29: 3 mL/kg/h via INTRAVENOUS

## 2020-05-29 MED ORDER — HYDRALAZINE HCL 20 MG/ML IJ SOLN
10.0000 mg | INTRAMUSCULAR | Status: AC | PRN
  Administered 2020-05-29: 10 mg via INTRAVENOUS

## 2020-05-29 MED ORDER — ONDANSETRON HCL 4 MG/2ML IJ SOLN: 4.0000 mg | INTRAMUSCULAR | Status: AC

## 2020-05-29 MED ORDER — FENTANYL CITRATE (PF) 100 MCG/2ML IJ SOLN
INTRAMUSCULAR | Status: AC
  Filled 2020-05-29: qty 2

## 2020-05-29 MED ORDER — FUROSEMIDE 10 MG/ML IJ SOLN
INTRAMUSCULAR | Status: AC
  Filled 2020-05-29: qty 4

## 2020-05-29 MED ORDER — FENTANYL CITRATE (PF) 100 MCG/2ML IJ SOLN
INTRAMUSCULAR | Status: DC | PRN
  Administered 2020-05-29: 25 ug via INTRAVENOUS

## 2020-05-29 MED ORDER — NITROGLYCERIN IN D5W 200-5 MCG/ML-% IV SOLN
20.0000 ug/min | INTRAVENOUS | Status: DC
  Administered 2020-05-29: 20 ug/min via INTRAVENOUS

## 2020-05-29 MED ORDER — FUROSEMIDE 10 MG/ML IJ SOLN
20.0000 mg | Freq: Three times a day (TID) | INTRAMUSCULAR | Status: DC
  Administered 2020-05-29 – 2020-05-30 (×2): 20 mg via INTRAVENOUS
  Filled 2020-05-29 (×2): qty 2

## 2020-05-29 MED ORDER — LIDOCAINE HCL (PF) 1 % IJ SOLN
INTRAMUSCULAR | Status: AC
  Filled 2020-05-29: qty 30

## 2020-05-29 MED ORDER — ASPIRIN 81 MG PO CHEW
81.0000 mg | CHEWABLE_TABLET | Freq: Every day | ORAL | Status: DC
  Administered 2020-05-30 – 2020-06-02 (×4): 81 mg via ORAL
  Filled 2020-05-29 (×4): qty 1

## 2020-05-29 MED ORDER — CARVEDILOL 6.25 MG PO TABS
6.2500 mg | ORAL_TABLET | Freq: Two times a day (BID) | ORAL | Status: DC
  Administered 2020-05-29: 6.25 mg via ORAL
  Filled 2020-05-29: qty 1

## 2020-05-29 MED ORDER — MAGNESIUM SULFATE 2 GM/50ML IV SOLN
2.0000 g | Freq: Once | INTRAVENOUS | Status: AC
  Administered 2020-05-29: 2 g via INTRAVENOUS
  Filled 2020-05-29: qty 50

## 2020-05-29 MED ORDER — HEPARIN (PORCINE) IN NACL 1000-0.9 UT/500ML-% IV SOLN
INTRAVENOUS | Status: DC | PRN
  Administered 2020-05-29 (×2): 500 mL

## 2020-05-29 MED ORDER — ALBUTEROL SULFATE HFA 108 (90 BASE) MCG/ACT IN AERS
1.0000 | INHALATION_SPRAY | Freq: Four times a day (QID) | RESPIRATORY_TRACT | Status: DC | PRN
  Administered 2020-05-29: 2 via RESPIRATORY_TRACT
  Filled 2020-05-29 (×2): qty 6.7

## 2020-05-29 MED ORDER — ALPRAZOLAM 0.5 MG PO TABS
0.5000 mg | ORAL_TABLET | Freq: Three times a day (TID) | ORAL | Status: DC
  Administered 2020-05-29 – 2020-06-01 (×7): 0.5 mg via ORAL
  Filled 2020-05-29 (×9): qty 1

## 2020-05-29 MED ORDER — ASPIRIN 81 MG PO CHEW: 81.0000 mg | CHEWABLE_TABLET | ORAL | Status: DC

## 2020-05-29 MED ORDER — SODIUM CHLORIDE 0.9 % IV SOLN: INTRAVENOUS | Status: DC

## 2020-05-29 MED ORDER — ACETAMINOPHEN 325 MG PO TABS: 650.0000 mg | ORAL_TABLET | ORAL | Status: DC | PRN

## 2020-05-29 MED ORDER — SODIUM CHLORIDE 0.9 % IV SOLN: 250.0000 mL | INTRAVENOUS | Status: DC | PRN

## 2020-05-29 MED ORDER — ONDANSETRON HCL 4 MG/2ML IJ SOLN
4.0000 mg | Freq: Once | INTRAMUSCULAR | Status: AC
  Administered 2020-05-29: 4 mg via INTRAVENOUS

## 2020-05-29 MED ORDER — ROSUVASTATIN CALCIUM 20 MG PO TABS
20.0000 mg | ORAL_TABLET | Freq: Every day | ORAL | Status: DC
  Administered 2020-05-29 – 2020-06-02 (×5): 20 mg via ORAL
  Filled 2020-05-29 (×5): qty 1

## 2020-05-29 MED ORDER — NITROGLYCERIN IN D5W 200-5 MCG/ML-% IV SOLN
INTRAVENOUS | Status: AC
  Filled 2020-05-29: qty 250

## 2020-05-29 MED ORDER — ONDANSETRON HCL 4 MG/2ML IJ SOLN
INTRAMUSCULAR | Status: AC
  Filled 2020-05-29: qty 2

## 2020-05-29 MED ORDER — CARVEDILOL 3.125 MG PO TABS
3.1250 mg | ORAL_TABLET | Freq: Two times a day (BID) | ORAL | Status: DC
  Administered 2020-05-30 – 2020-06-02 (×6): 3.125 mg via ORAL
  Filled 2020-05-29 (×7): qty 1

## 2020-05-29 MED ORDER — SACUBITRIL-VALSARTAN 24-26 MG PO TABS
1.0000 | ORAL_TABLET | Freq: Two times a day (BID) | ORAL | Status: DC
  Administered 2020-05-29 (×2): 1 via ORAL
  Filled 2020-05-29 (×5): qty 1

## 2020-05-29 MED ORDER — ONDANSETRON HCL 4 MG/2ML IJ SOLN
4.0000 mg | Freq: Four times a day (QID) | INTRAMUSCULAR | Status: DC | PRN
  Administered 2020-05-29 – 2020-06-01 (×4): 4 mg via INTRAVENOUS
  Filled 2020-05-29 (×3): qty 2

## 2020-05-29 MED ORDER — SODIUM CHLORIDE 0.9 % WEIGHT BASED INFUSION: 1.0000 mL/kg/h | INTRAVENOUS | Status: DC

## 2020-05-29 MED ORDER — HEPARIN SODIUM (PORCINE) 1000 UNIT/ML IJ SOLN
INTRAMUSCULAR | Status: DC | PRN
  Administered 2020-05-29: 3500 [IU] via INTRAVENOUS

## 2020-05-29 MED ORDER — SODIUM CHLORIDE 0.9% FLUSH
3.0000 mL | Freq: Two times a day (BID) | INTRAVENOUS | Status: DC
  Administered 2020-05-29 – 2020-05-30 (×3): 3 mL via INTRAVENOUS

## 2020-05-29 MED ORDER — ONDANSETRON HCL 4 MG/2ML IJ SOLN
INTRAMUSCULAR | Status: AC
  Administered 2020-05-29: 4 mg via INTRAVENOUS
  Filled 2020-05-29: qty 2

## 2020-05-29 MED ORDER — SODIUM CHLORIDE 0.9% FLUSH
3.0000 mL | Freq: Two times a day (BID) | INTRAVENOUS | Status: DC
  Administered 2020-05-30: 3 mL via INTRAVENOUS

## 2020-05-29 MED ORDER — POTASSIUM CHLORIDE CRYS ER 10 MEQ PO TBCR
10.0000 meq | EXTENDED_RELEASE_TABLET | Freq: Three times a day (TID) | ORAL | Status: AC
  Administered 2020-05-29 – 2020-05-31 (×5): 10 meq via ORAL
  Filled 2020-05-29 (×7): qty 1

## 2020-05-29 MED ORDER — SODIUM CHLORIDE 0.9% FLUSH: 3.0000 mL | INTRAVENOUS | Status: DC | PRN

## 2020-05-29 MED ORDER — NITROGLYCERIN 1 MG/10 ML FOR IR/CATH LAB
INTRA_ARTERIAL | Status: AC
  Filled 2020-05-29: qty 10

## 2020-05-29 MED ORDER — HEPARIN SODIUM (PORCINE) 1000 UNIT/ML IJ SOLN
INTRAMUSCULAR | Status: AC
  Filled 2020-05-29: qty 1

## 2020-05-29 MED ORDER — HYDRALAZINE HCL 20 MG/ML IJ SOLN
INTRAMUSCULAR | Status: AC
  Filled 2020-05-29: qty 1

## 2020-05-29 MED ORDER — HEPARIN (PORCINE) IN NACL 1000-0.9 UT/500ML-% IV SOLN
INTRAVENOUS | Status: AC
  Filled 2020-05-29: qty 1500

## 2020-05-29 MED ORDER — IOHEXOL 350 MG/ML SOLN
INTRAVENOUS | Status: DC | PRN
  Administered 2020-05-29: 30 mL

## 2020-05-29 MED ORDER — FUROSEMIDE 10 MG/ML IJ SOLN
40.0000 mg | Freq: Once | INTRAMUSCULAR | Status: AC
  Administered 2020-05-29: 40 mg via INTRAVENOUS

## 2020-05-29 MED ORDER — SPIRONOLACTONE 12.5 MG HALF TABLET
12.5000 mg | ORAL_TABLET | Freq: Every day | ORAL | Status: DC
  Administered 2020-05-29 – 2020-06-02 (×5): 12.5 mg via ORAL
  Filled 2020-05-29 (×6): qty 1

## 2020-05-29 MED ORDER — SODIUM CHLORIDE 0.9 % IV SOLN
250.0000 mL | INTRAVENOUS | Status: DC | PRN
Start: 2020-05-29 — End: 2020-06-02

## 2020-05-29 MED ORDER — VERAPAMIL HCL 2.5 MG/ML IV SOLN
INTRAVENOUS | Status: AC
  Filled 2020-05-29: qty 2

## 2020-05-29 MED ORDER — ENOXAPARIN SODIUM 40 MG/0.4ML ~~LOC~~ SOLN
40.0000 mg | SUBCUTANEOUS | Status: DC
  Administered 2020-05-30 – 2020-06-02 (×3): 40 mg via SUBCUTANEOUS
  Filled 2020-05-29 (×4): qty 0.4

## 2020-05-29 MED ORDER — DIAZEPAM 2 MG PO TABS
2.0000 mg | ORAL_TABLET | Freq: Three times a day (TID) | ORAL | Status: DC
  Administered 2020-05-29 – 2020-05-31 (×3): 2 mg via ORAL
  Filled 2020-05-29 (×6): qty 1

## 2020-05-29 MED ORDER — MIDAZOLAM HCL 2 MG/2ML IJ SOLN
INTRAMUSCULAR | Status: DC | PRN
  Administered 2020-05-29: 2 mg via INTRAVENOUS

## 2020-05-29 MED ORDER — MIDAZOLAM HCL 2 MG/2ML IJ SOLN
INTRAMUSCULAR | Status: AC
  Filled 2020-05-29: qty 2

## 2020-05-29 MED ORDER — SODIUM CHLORIDE 0.9% FLUSH
3.0000 mL | INTRAVENOUS | Status: DC | PRN
Start: 2020-05-29 — End: 2020-06-02

## 2020-05-29 MED ORDER — VERAPAMIL HCL 2.5 MG/ML IV SOLN
INTRAVENOUS | Status: DC | PRN
  Administered 2020-05-29: 10 mL via INTRA_ARTERIAL

## 2020-05-29 MED ORDER — LIDOCAINE HCL (PF) 1 % IJ SOLN
INTRAMUSCULAR | Status: DC | PRN
  Administered 2020-05-29 (×2): 2 mL

## 2020-05-29 MED ORDER — TICAGRELOR 90 MG PO TABS
ORAL_TABLET | ORAL | Status: AC
  Filled 2020-05-29: qty 1

## 2020-05-29 SURGICAL SUPPLY — 11 items
CATH BALLN WEDGE 5F 110CM (CATHETERS) ×2 IMPLANT
CATH OPTITORQUE TIG 4.0 5F (CATHETERS) ×2 IMPLANT
DEVICE RAD COMP TR BAND LRG (VASCULAR PRODUCTS) ×2 IMPLANT
GLIDESHEATH SLEND A-KIT 6F 22G (SHEATH) ×2 IMPLANT
GUIDEWIRE INQWIRE 1.5J.035X260 (WIRE) ×1 IMPLANT
INQWIRE 1.5J .035X260CM (WIRE) ×2
KIT HEART LEFT (KITS) ×2 IMPLANT
PACK CARDIAC CATHETERIZATION (CUSTOM PROCEDURE TRAY) ×2 IMPLANT
SHEATH GLIDE SLENDER 4/5FR (SHEATH) ×2 IMPLANT
TRANSDUCER W/STOPCOCK (MISCELLANEOUS) ×2 IMPLANT
TUBING CIL FLEX 10 FLL-RA (TUBING) ×2 IMPLANT

## 2020-05-29 NOTE — Interval H&P Note (Signed)
History and Physical Interval Note:  05/29/2020 11:08 AM  Jermaine Morris  has presented today for surgery, with the diagnosis of shortness of breath.  The various methods of treatment have been discussed with the patient and family. After consideration of risks, benefits and other options for treatment, the patient has consented to  Procedure(s): RIGHT/LEFT HEART CATH AND CORONARY ANGIOGRAPHY (N/A) and possible angioplasty as a surgical intervention.  The patient's history has been reviewed, patient examined, no change in status, stable for surgery.  I have reviewed the patient's chart and labs.  Questions were answered to the patient's satisfaction.    Cath Lab Visit (complete for each Cath Lab visit)  Clinical Evaluation Leading to the Procedure:   ACS: No.  Non-ACS:    Anginal Classification: CCS III  Anti-ischemic medical therapy: No Therapy  Non-Invasive Test Results: High-risk stress test findings: cardiac mortality >3%/year  Prior CABG: No previous CABG   Yates Decamp

## 2020-05-29 NOTE — Progress Notes (Signed)
Lasix 20mg  IV given at 12N

## 2020-05-29 NOTE — Progress Notes (Signed)
C/O lower leg cramps; given mustard

## 2020-05-29 NOTE — Progress Notes (Signed)
Continues to rest quietly. 

## 2020-05-29 NOTE — Progress Notes (Signed)
Spoke w/Dr. Jacinto Halim on phone regarding muscle cramps and nervousness

## 2020-05-29 NOTE — Telephone Encounter (Signed)
Pts wife called and stated that she would like an update on her husband. She tried to get the info from her son, but she stated he does not know whats going on.

## 2020-05-29 NOTE — Progress Notes (Signed)
Resting quietly in stretcher, eyes closed. Respirations nonlabored

## 2020-05-29 NOTE — Telephone Encounter (Signed)
I called patient's wife Zebulen Simonis and discussed with her that patient is critically sick and discussed with her that patient is critically ill with acute decompensated heart failure with severely reduced LVEF.  Guarded prognosis discussed.  She stated that he has not been taking care of himself and in fact wanted him to see a physician almost 1 to 2 years ago.  I had earlier discussed patient's situation with his son as well.  Discussed CAD, ischemic and probably non ischemic cardiomyopathy. Need for hospitalization discussed.   Multiple medication changes and orders placed for CHF management.   Yates Decamp, MD, [facility] 05/29/2020, 5:22 PM Office: [phone] Pager: [phone]

## 2020-05-29 NOTE — Progress Notes (Signed)
Patient very short of breath, sweaty. On 3L [place]. Dr. Jacinto Halim paged and in to see patient. Orders taken. Sitting straight up

## 2020-05-29 NOTE — Progress Notes (Signed)
Pt c/o nausea, notifed Dr Jacinto Halim and staff in cath lab room #5, give zofran 4 mg IV now before coming to cath lab.

## 2020-05-29 NOTE — Progress Notes (Signed)
Nauseated, throwing up. Paging Dr. Jacinto Halim

## 2020-05-29 NOTE — Research (Signed)
[place] Informed Consent   Subject Name: Jermaine Morris  Subject met inclusion and exclusion criteria.  The informed consent form, study requirements and expectations were reviewed with the subject and questions and concerns were addressed prior to the signing of the consent form.  The subject verbalized understanding of the trail requirements.  The subject agreed to participate in the [facility] trial and signed the informed consent.  The informed consent was obtained prior to performance of any protocol-specific procedures for the subject.  A copy of the signed informed consent was given to the subject and a copy was placed in the subject's medical record.  Philemon Kingdom D 05/29/2020, 0823 am

## 2020-05-29 NOTE — Progress Notes (Signed)
Pt came to the floor with Nitro drip. Nitro drip was running at 10 mcg/min; 76m/hr. Notified night shift RN to clarify with MD regarding order for slower rate.

## 2020-05-29 NOTE — Progress Notes (Signed)
Tr band removed at 15:05, tegaderm dressing applied. Patient had episode of nausea and vomiting at 14:50. Resolved with 4mg  zofran.

## 2020-05-29 NOTE — Progress Notes (Signed)
Pt was steady on his feet. Not complaining of any pain except for shivering. Temp 97.59F  Heated blanket relieved some shivering. Cath dressing site was clean, dry and intact.

## 2020-05-30 ENCOUNTER — Encounter (HOSPITAL_COMMUNITY): Payer: Self-pay | Admitting: Cardiology

## 2020-05-30 LAB — BASIC METABOLIC PANEL
Anion gap: 13 (ref 5–15)
BUN: 18 mg/dL (ref 6–20)
CO2: 24 mmol/L (ref 22–32)
Calcium: 9.3 mg/dL (ref 8.9–10.3)
Chloride: 98 mmol/L (ref 98–111)
Creatinine, Ser: 1.1 mg/dL (ref 0.61–1.24)
GFR, Estimated: >60 mL/min (ref >60)
Glucose, Bld: 132 mg/dL — ABNORMAL HIGH (ref 70–99)
Potassium: 4 mmol/L (ref 3.5–5.1)
Sodium: 135 mmol/L (ref 135–145)

## 2020-05-30 LAB — CBC
HCT: 46.9 % (ref 39.0–52.0)
Hemoglobin: 16.8 g/dL (ref 13.0–17.0)
MCH: 34.7 pg — ABNORMAL HIGH (ref 26.0–34.0)
MCHC: 35.8 g/dL (ref 30.0–36.0)
MCV: 96.9 fL (ref 80.0–100.0)
Platelets: 254 10*3/uL (ref 150–400)
RBC: 4.84 MIL/uL (ref 4.22–5.81)
RDW: 12.1 % (ref 11.5–15.5)
WBC: 10.9 10*3/uL — ABNORMAL HIGH (ref 4.0–10.5)
nRBC: 0 % (ref 0.0–0.2)

## 2020-05-30 LAB — MAGNESIUM: Magnesium: 2.2 mg/dL (ref 1.7–2.4)

## 2020-05-30 LAB — BRAIN NATRIURETIC PEPTIDE: B Natriuretic Peptide: 647.6 pg/mL — ABNORMAL HIGH (ref 0.0–100.0)

## 2020-05-30 MED ORDER — SACUBITRIL-VALSARTAN 49-51 MG PO TABS
1.0000 | ORAL_TABLET | Freq: Two times a day (BID) | ORAL | Status: DC
  Filled 2020-05-30: qty 1

## 2020-05-30 MED ORDER — FUROSEMIDE 40 MG PO TABS
40.0000 mg | ORAL_TABLET | Freq: Two times a day (BID) | ORAL | Status: DC
  Administered 2020-05-30 – 2020-05-31 (×2): 40 mg via ORAL
  Filled 2020-05-30 (×3): qty 1

## 2020-05-30 MED ORDER — SACUBITRIL-VALSARTAN 24-26 MG PO TABS
1.0000 | ORAL_TABLET | Freq: Two times a day (BID) | ORAL | Status: DC
  Administered 2020-05-30 – 2020-06-01 (×4): 1 via ORAL
  Filled 2020-05-30 (×4): qty 1

## 2020-05-30 MED FILL — Nitroglycerin IV Soln 100 MCG/ML in D5W: INTRA_ARTERIAL | Qty: 10 | Status: AC

## 2020-05-30 MED FILL — Heparin Sod (Porcine)-NaCl IV Soln 1000 Unit/500ML-0.9%: INTRAVENOUS | Qty: 500 | Status: AC

## 2020-05-30 NOTE — Plan of Care (Signed)
  Problem: Coping: Goal: Level of anxiety will decrease Outcome: Progressing   Problem: Pain Managment: Goal: General experience of comfort will improve Outcome: Progressing   Problem: Safety: Goal: Ability to remain free from injury will improve Outcome: Progressing   

## 2020-05-30 NOTE — Progress Notes (Signed)
Provider contacted regarding Nitro rate via phone. Advised to leave at current rate.

## 2020-05-30 NOTE — H&P (Signed)
Primary Physician/Referring:  Pieter Partridge, PA  Patient ID: Jermaine Morris, male    DOB: 08/18/1960, 60 y.o.   MRN: [id number] CC: Shortness of breath. HPI:    Jermaine Morris  is a 60 y.o. Caucasian male patient with COPD, interstitial lung disease by CXR 02/16/2020, GERD, referred to me for evaluation of dyspnea and abnormal EKG. past medical history significant for tobacco use disorder quit 3 months ago in September 2021, hyperlipidemia.   Patient was scheduled for left and right heart catheterization, patient had undergone nuclear stress testing which was high risk and echocardiogram showing cardiomyopathy with LVEF of 15-20%.  Patient's coronary calcium score of 854 with some in the 68 percentile for age and sex.  Patient had been doing well until recently, history is extremely difficult to elucidate from patient.  However he has been markedly dyspneic and diaphoretic with nausea and not feeling well overall.  He presented for cardiac catheterization.  No chest pain, palpitations, dizziness or syncope.  Past Medical History:  Diagnosis Date  . COPD (chronic obstructive pulmonary disease) ([place])   . Hyperlipidemia    Past Surgical History:  Procedure Laterality Date  . CHOLECYSTECTOMY    . RIGHT/LEFT HEART CATH AND CORONARY ANGIOGRAPHY N/A 05/29/2020   Procedure: RIGHT/LEFT HEART CATH AND CORONARY ANGIOGRAPHY;  Surgeon: Adrian Prows, MD;  Location: [facility];  Service: Cardiovascular;  Laterality: N/A;   Family History  Problem Relation Age of Onset  . Cancer Father   . Heart attack Brother     Social History   Tobacco Use  . Smoking status: Former Smoker    Packs/day: 1.50    Years: 30.00    Pack years: 45.00    Types: Cigarettes    Quit date: 12/2019    Years since quitting: 0.4  . Smokeless tobacco: Current User    Types: Snuff  . Tobacco comment: Oct 2021, recently quit Chew also  Substance Use Topics  . Alcohol use: No   Marital Status: Married  ROS   Review of Systems  Constitutional: Positive for malaise/fatigue. Negative for weight gain.  Cardiovascular: Positive for dyspnea on exertion, orthopnea and paroxysmal nocturnal dyspnea. Negative for chest pain, claudication, leg swelling, near-syncope, palpitations and syncope.  Respiratory: Negative for cough, sputum production and wheezing.   Hematologic/Lymphatic: Does not bruise/bleed easily.  Musculoskeletal: Negative.   Gastrointestinal: Negative for melena.  Neurological: Positive for weakness. Negative for dizziness.  Psychiatric/Behavioral: Negative.   Other systems negative Objective  Blood pressure 93/73, pulse 81, temperature 98.1 F (36.7 C), temperature source Oral, resp. rate 15, height 6' (1.829 m), weight 72.2 kg, SpO2 96 %.  Vitals with BMI 05/30/2020 05/30/2020 05/30/2020  Height - - -  Weight - - -  BMI - - -  Systolic 93 536 89  Diastolic 73 65 68  Pulse - - -     Physical Exam Vitals reviewed.  Constitutional:      General: He is in acute distress.     Appearance: He is normal weight. He is ill-appearing and diaphoretic.  HENT:     Head: Normocephalic and atraumatic.  Eyes:     Extraocular Movements: Extraocular movements intact.  Neck:     Comments: JVD present Cardiovascular:     Rate and Rhythm: Normal rate and regular rhythm.     Pulses: Intact distal pulses.     Heart sounds: Normal heart sounds, S1 normal and S2 normal. No murmur heard. No gallop.  Comments: No leg edema Pulmonary:     Effort: Respiratory distress present.     Breath sounds: Wheezing (scattered) and rales (bilateral bases fine crackles) present. No rhonchi.  Abdominal:     General: Bowel sounds are normal.     Palpations: Abdomen is soft.  Musculoskeletal:     Right lower leg: No edema.     Left lower leg: No edema.  Skin:    General: Skin is warm.     Capillary Refill: Capillary refill takes less than 2 seconds.  Neurological:     General: No focal deficit present.      Mental Status: He is alert and oriented to person, place, and time.    Laboratory examination:   Recent Labs    05/23/20 1000 05/29/20 1118 05/29/20 1124 05/29/20 1929 05/30/20 0337  NA 140 139  139 139  --  135  K 5.1 4.3  4.3 4.2  --  4.0  CL 100  --   --   --  98  CO2 23  --   --   --  24  GLUCOSE 116*  --   --   --  132*  BUN 14  --   --   --  18  CREATININE 1.05  --   --  1.16 1.10  CALCIUM 9.1  --   --   --  9.3  GFRNONAA  --   --   --  >60 >60   estimated creatinine clearance is 73.8 mL/min (by C-G formula based on SCr of 1.1 mg/dL).  CMP Latest Ref Rng & Units 05/30/2020 05/29/2020 05/29/2020  Glucose 70 - 99 mg/dL 132(H) - -  BUN 6 - 20 mg/dL 18 - -  Creatinine 0.61 - 1.24 mg/dL 1.10 1.16 -  Sodium 135 - 145 mmol/L 135 - 139  Potassium 3.5 - 5.1 mmol/L 4.0 - 4.2  Chloride 98 - 111 mmol/L 98 - -  CO2 22 - 32 mmol/L 24 - -  Calcium 8.9 - 10.3 mg/dL 9.3 - -  Total Protein 6.0 - 8.3 g/dL - - -  Total Bilirubin 0.3 - 1.2 mg/dL - - -  Alkaline Phos 39 - 117 U/L - - -  AST 0 - 37 U/L - - -  ALT 0 - 53 U/L - - -   CBC Latest Ref Rng & Units 05/30/2020 05/29/2020 05/29/2020  WBC 4.0 - 10.5 K/uL 10.9(H) 10.9(H) -  Hemoglobin 13.0 - 17.0 g/dL 16.8 16.6 15.3  Hematocrit 39.0 - 52.0 % 46.9 46.3 45.0  Platelets 150 - 400 K/uL 254 274 -    Lipid Panel No results for input(s): CHOL, TRIG, LDLCALC, VLDL, HDL, CHOLHDL, LDLDIRECT in the last 8760 hours.  HEMOGLOBIN A1C No results found for: HGBA1C, MPG TSH No results for input(s): TSH in the last 8760 hours.  External labs:   Labs 04/05/2020:   Total cholesterol 206, triglycerides 74, HDL 59, LDL 146.  Non-HDL cholesterol 147.  Sodium 137, potassium 4.6, BUN 11, creatinine 1.01, EGFR >60 mL.  TSH normal.  Medications and allergies   Allergies  Allergen Reactions  . Codeine Nausea And Vomiting     (Not in an outpatient encounter)   Radiology:   CT of the abdomen 2015: Aortic atherosclerosis, no  aneurysm.  Chest x-ray PA and lateral view 02/16/2020: Cardiovascular: Cardiac silhouette and pulmonary vasculature are within normal limits.  Mediastinum: Within normal limits.  Lungs/pleura: No pneumothorax or pleural effusion. Coarse interstitial and reticular opacities noted  throughout both lungs. No consolidation or focal airspace opacities.  Cardiac Studies:   CT cardiac scoring 05/09/2020: Left Main: 0 LAD: 623 LCx: 228 RCA: 3.4 Total Agatston Score: 854 MESA database percentile: 96 AORTA MEASUREMENTS: Ascending Aorta: 28 mm Descending Aorta: 24 mm OTHER FINDINGS: Heart is normal size. Aorta normal caliber. No adenopathy. No confluent opacities or effusions. Imaging into the upper abdomen demonstrates no acute findings. Chest wall soft tissues are unremarkable. No acute bony abnormality.  IMPRESSION: Total Agatston score: [place] percentile: 96 No acute or significant extracardiac abnormality.  PCV ECHOCARDIOGRAM COMPLETE 05/04/2020 Left ventricle cavity is severely dilated. Normal left ventricular wall thickness. Severe global hypokinesis, LVEF 15-20%. Diastolic function not assessed due to severity of mitral regurgitation. Spontaneous echocontrast seen in LV cavity, without evidence of thrombus on this non-contrast study. Left atrial cavity is severely dilated. Severe posteriorly directed mitral regurgitation. Moderate tricuspid regurgitation. Estimated pulmonary artery systolic pressure 43 mmHg.   PCV MYOCARDIAL PERFUSION WITH LEXISCAN 05/02/2020 Lexiscan nuclear stress test performed using 1-day protocol. SPECT images show large LV cavity with global decrease in counts. In addition, there is medium sized, severe intensity, predominantly fixed perfusion defect in anterior apical myocardium. Global decrease in wall motion and thickening. Stress LVEF 13%. High risk study.  Right and left heart catheterization 05/29/20  RA 14/15, mean 11 mmHg. RV  61/7, EDP 14 mmHg. PA 63/27, mean 48 mmHg.  PA saturation 68%. PW 37/34, mean 32 mmHg.  Aortic saturation 98%. Cardiac output 4.28, cardiac index 2.13 by Fick.  LV: 134/24, EDP 37 mmHg.  There is no pressure gradient across the aortic valve. LM: Large vessel, has mild calcification. LAD: Proximal segment at septal perforator 1 has a 70 to 80% stenosis.  Following this there is 100% occlusion with ipsilateral and bridging collaterals to the LAD, short segment occlusion.  Large D1 and D2.  Diffuse mild coronary calcification is evident. CX: Large caliber vessel.  Gives origin to 2 large marginals.  Mild diffuse disease. RCA: Large-caliber vessel.  Mild disease is present.  Impression: Patient is in acute decompensated heart failure with severely elevated LVEDP and pulmonary capillary wedge pressure.  Patient is also diaphoretic and has not been feeling well for the last few days.  He will need to be admitted to the hospital for diuresis and stabilization.  LAD is very short segment occlusion and appears to be amenable for revascularization, in view of very young age, still worthwhile to proceed with angioplasty to see whether there will be remodeling of his LV with revascularization when stable hemodynamically.  30 mL contrast utilized.   EKG:   EKG 04/13/2020: Sinus tachycardia at rate of 103 bpm, biatrial enlargement, left bundle branch block.  PVCs (2).  No further analysis.  No significant change from 02/16/2020.   Assessment   1.  Acute on chronic systolic heart failure with acute pulmonary edema and respiratory distress. 2.  Coronary artery disease in native vessel with other forms of angina pectoris. 3.  Ischemic and nonischemic cardiomyopathy, LAD stenosis does not explain global hypokinesis and EF of 15 to 20%. 4.  Primary hypertension 5.  Hyperlipidemia  Recommendations:   Royal Vandevoort is a 60 y.o. Caucasian male patient with COPD, interstitial lung disease by CXR 02/16/2020,  GERD, referred to me for evaluation of dyspnea and abnormal EKG. past medical history significant for tobacco use disorder quit 3 months ago in September 2021, hyperlipidemia underwent cardiac catheterization earlier today, I am admitting the patient with acute decompensated  heart failure.  Patient diaphoretic, and in respiratory distress, presently does not feel well with severe orthopnea.  I reviewed the cardiac catheterization report with the patient and his wife.  Patient has marked elevated EDP and severe pulmonary tension related to acute on chronic systolic heart failure.  Patient will be admitted to the hospital, will be diuresed and guideline directed medical therapy for acute systolic heart failure will be administered.  Patient previously has not been taking any medication.  Statin therapy will also be initiated.  Once he is stable from medical standpoint, will consider PCI to the LAD.  Extensive discussion with the patient regarding pros and cons, discussions regarding severe mitral regurgitation which is probably secondary and pulmonary tension.  Orders placed for admission.  This was 1 hour critical care time, I spent 35 minutes or more with discussions regarding his management with the staff, patient and later with his wife as well.   Adrian Prows, MD, [facility] 05/30/2020, 9:25 PM Office: [phone] Pager: [phone]  Patient has chronic dyspnea on exertion ongoing for several months and has gradually progressed. No PND or orthopnea.  Am glad that he has quit smoking.  His physical examination is unremarkable except for abnormal lung exam.  His EKG reveals underlying left bundle branch block.  He has recently started statins for hyperlipidemia and in view of abdominal aortic atherosclerosis noted on the CT scan almost 7 years ago, his goal LDL should be <70.  He is presently only 60 years of age.  If cardiac work-up is negative, would strongly consider pulmonary consultation specifically to  evaluate for ILD and will need high-resolution CT for confirmation.  Schedule for a Lexiscan Nuclear stress test to evaluate for myocardial ischemia. Patient unable to do treadmill stress testing due to LBBB.  His underlying sinus tachycardia is also concerning but may be related to pulmonary issues as well.  Will schedule for an echocardiogram. Will obtain  coronary calcium score for further risk stratification. Office visit following the work-up/investigations. Patient instructed to start ASA  75m q daily for prophylaxis.  This was a 60-minute office visit encounter with evaluation of external records, discussion regarding primary and secondary prevention and discussions regarding effects of smoking and medication use.    JAdrian Prows MD, [facility]3/23/2022, 9:16 PM Office: [phone]

## 2020-05-30 NOTE — Progress Notes (Signed)
Patient slept a lot of the day stating he had been up most of the night feeling ill. Blood pressure was soft, NP notified, nitro drip stopped in the am and meds held due to low bp. Patient had visitors in the mid-afternoon and seemed in better spirits.

## 2020-05-30 NOTE — Progress Notes (Signed)
Subjective:  Patient presented yesterday for elective cardiac catheterization which revealed occlusion to LAD which appeared amenable for revascularization. However patient was in acute decompensated heart failure with severely elevated LVEDP and pulmonary capillary wedge pressure, therefore Dr. Einar Gip opted to hold off on revascularization of the LAD and instead recommended hospitalization to diuresis stabilize patient from a heart failure standpoint.  Prior to cardiac catheterization yesterday morning patient was diaphoretic, nauseous, and vomiting.  Which patient states this is normal for when he has to fast.  Following cardiac catheterization patient was started on furosemide, carvedilol, spironolactone, and Entresto.   Patient seen and examined this morning in bed at approximately 8:00 AM.  He is resting comfortably presently on supplemental oxygen via nasal cannula at 2 L/min.  Patient states he is feeling significantly improved compared to yesterday.  He continues to have mild nausea, but has had no recurrence of emesis since yesterday.  He also states his dyspnea has significantly improved.  Patient's creatinine is stable at 1.1.  Chest x-ray showed no evidence of pulmonary edema or pleural effusion.  BNP is elevated at 647.6.    His recorded weight has also trended down since yesterday from 79 kg to 72 kg, and patient is only net negative 59m.  Suspect daily weights and strict INO's accurate.  Patient is presently requiring supplemental oxygen nasal cannula.  Intake/Output from previous day:  I/O last 3 completed shifts: In: 480 [P.O.:480] Out: 675 [Urine:675] Total I/O In: 375.7 [P.O.:300; I.V.:75.7] Out: 200 [Urine:200]  Blood pressure 110/65, pulse 81, temperature 98.5 F (36.9 C), temperature source Oral, resp. rate 17, height 6' (1.829 m), weight 72.2 kg, SpO2 96 %. Physical Exam Constitutional:      General: He is not in acute distress.    Appearance: He is normal weight. He is  not diaphoretic.  HENT:     Head: Normocephalic and atraumatic.     Mouth/Throat:     Mouth: Mucous membranes are moist.  Eyes:     Extraocular Movements: Extraocular movements intact.     Conjunctiva/sclera: Conjunctivae normal.  Neck:     Vascular: No carotid bruit or JVD.  Cardiovascular:     Rate and Rhythm: Normal rate and regular rhythm.     Pulses:          Carotid pulses are 2+ on the right side and 2+ on the left side.      Radial pulses are 2+ on the right side and 2+ on the left side.       Femoral pulses are 2+ on the right side and 2+ on the left side.      Popliteal pulses are 2+ on the right side and 2+ on the left side.       Dorsalis pedis pulses are 2+ on the right side and 2+ on the left side.       Posterior tibial pulses are 1+ on the right side and 1+ on the left side.     Heart sounds: S1 normal and S2 normal. No murmur heard.   Pulmonary:     Effort: Pulmonary effort is normal.     Breath sounds: Rales (Bilateral lower bases) present. No wheezing or rhonchi.  Musculoskeletal:     Cervical back: Normal range of motion and neck supple.  Neurological:     Mental Status: He is alert.     Lab Results: BMP BNP (last 3 results) Recent Labs    05/30/20 0337  BNP 647.6*  ProBNP (last 3 results) No results for input(s): PROBNP in the last 8760 hours. BMP Latest Ref Rng & Units 05/30/2020 05/29/2020 05/29/2020  Glucose 70 - 99 mg/dL 794(F) - -  BUN 6 - 20 mg/dL 18 - -  Creatinine 2.76 - 1.24 mg/dL 1.47 0.92 -  BUN/Creat Ratio 9 - 20 - - -  Sodium 135 - 145 mmol/L 135 - 139  Potassium 3.5 - 5.1 mmol/L 4.0 - 4.2  Chloride 98 - 111 mmol/L 98 - -  CO2 22 - 32 mmol/L 24 - -  Calcium 8.9 - 10.3 mg/dL 9.3 - -   Hepatic Function Latest Ref Rng & Units 08/13/2013  Total Protein 6.0 - 8.3 g/dL 7.6  Albumin 3.5 - 5.2 g/dL 4.4  AST 0 - 37 U/L 20  ALT 0 - 53 U/L 24  Alk Phosphatase 39 - 117 U/L 114  Total Bilirubin 0.3 - 1.2 mg/dL 0.8   CBC Latest Ref Rng &  Units 05/30/2020 05/29/2020 05/29/2020  WBC 4.0 - 10.5 K/uL 10.9(H) 10.9(H) -  Hemoglobin 13.0 - 17.0 g/dL 95.7 47.3 40.3  Hematocrit 39.0 - 52.0 % 46.9 46.3 45.0  Platelets 150 - 400 K/uL 254 274 -   Lipid Panel  No results found for: CHOL, TRIG, HDL, CHOLHDL, VLDL, LDLCALC, LDLDIRECT Cardiac Panel (last 3 results) No results for input(s): CKTOTAL, CKMB, TROPONINI, RELINDX in the last 72 hours.  HEMOGLOBIN A1C No results found for: HGBA1C, MPG TSH No results for input(s): TSH in the last 8760 hours.  Imaging: DG CHEST PORT 1 VIEW 05/29/2020 The heart size and mediastinal contours are unchanged. No focal consolidation. No pulmonary edema. No pleural effusion. No pneumothorax. No acute osseous abnormality.  IMPRESSION: No active disease.  Cardiac Studies: CT cardiac scoring 05/09/2020: Left Main: 0 LAD: 623 LCx: 228 RCA: 3.4 Total Agatston Score: 854 MESA database percentile: 96 AORTA MEASUREMENTS: Ascending Aorta: 28 mm Descending Aorta: 24 mm OTHER FINDINGS: Heart is normal size. Aorta normal caliber. No adenopathy. No confluent opacities or effusions. Imaging into the upper abdomen demonstrates no acute findings. Chest wall soft tissues are unremarkable. No acute bony abnormality.  IMPRESSION: Total Agatston score: 854 [place] database percentile: 96 No acute or significant extracardiac abnormality.  PCV ECHOCARDIOGRAM COMPLETE 05/04/2020 Left ventricle cavity is severely dilated. Normal left ventricular wall thickness. Severe global hypokinesis, LVEF 15-20%. Diastolic function not assessed due to severity of mitral regurgitation. Spontaneous echocontrast seen in LV cavity, without evidence of thrombus on this non-contrast study. Left atrial cavity is severely dilated. Severe posteriorly directed mitral regurgitation. Moderate tricuspid regurgitation. Estimated pulmonary artery systolic pressure 43 mmHg.   PCV MYOCARDIAL PERFUSION WITH LEXISCAN  05/02/2020 Lexiscan nuclear stress test performed using 1-day protocol. SPECT images show large LV cavity with global decrease in counts. In addition, there is medium sized, severe intensity, predominantly fixed perfusion defect in anterior apical myocardium. Global decrease in wall motion and thickening. Stress LVEF 13%. High risk study.  CARDIAC CATHETERIZATION 05/29/2020:  Right and left heart catheterization 05/29/20 RA 14/15, mean 11 mmHg. RV 61/7, EDP 14 mmHg. PA 63/27, mean 48 mmHg.  PA saturation 68%. PW 37/34, mean 32 mmHg.  Aortic saturation 98%. Cardiac output 4.28, cardiac index 2.13 by Fick. LV: 134/24, EDP 37 mmHg.  There is no pressure gradient across the aortic valve. LM: Large vessel, has mild calcification. LAD: Proximal segment at septal perforator 1 has a 70 to 80% stenosis.  Following this there is 100% occlusion with ipsilateral and bridging collaterals to the  LAD, short segment occlusion.  Large D1 and D2.  Diffuse mild coronary calcification is evident. CX: Large caliber vessel.  Gives origin to 2 large marginals.  Mild diffuse disease. RCA: Large-caliber vessel.  Mild disease is present.   Impression: Patient is in acute decompensated heart failure with severely elevated LVEDP and pulmonary capillary wedge pressure.  Patient is also diaphoretic and has not been feeling well for the last few days.  He will need to be admitted to the hospital for diuresis and stabilization.  LAD is very short segment occlusion and appears to be amenable for revascularization, in view of very young age, still worthwhile to proceed with angioplasty to see whether there will be remodeling of his LV with revascularization when stable hemodynamically.  30 mL contrast utilized.  EKG:  EKG 05/29/2020: Sinus rhythm with frequent PVCs at a rate 68 bpm.  Normal axis.  Poor R wave progression, cannot exclude anteroseptal infarct.  EKG 04/13/2020: Sinus tachycardia at rate of 103 bpm, biatrial enlargement, left  bundle branch block.  PVCs (2).  No further analysis.  No significant change from 02/16/2020.   No results found for this or any previous visit (from the past 43800 hour(s)).  Scheduled Meds: . ALPRAZolam  0.5 mg Oral TID  . aspirin  81 mg Oral Daily  . carvedilol  3.125 mg Oral BID WC  . diazepam  2 mg Oral TID  . enoxaparin (LOVENOX) injection  40 mg Subcutaneous Q24H  . furosemide  40 mg Oral BID  . potassium chloride  10 mEq Oral TID  . rosuvastatin  20 mg Oral Daily  . sacubitril-valsartan  1 tablet Oral BID  . sodium chloride flush  3 mL Intravenous Q12H  . sodium chloride flush  3 mL Intravenous Q12H  . spironolactone  12.5 mg Oral Daily   Continuous Infusions: . sodium chloride 10 mL/hr at 05/29/20 0815  . sodium chloride     PRN Meds:.sodium chloride, acetaminophen, albuterol, ondansetron (ZOFRAN) IV, sodium chloride flush  Assessment/Plan:  1. Acute decompensated systolic heart failure During cardiac catheterization yesterday patient in acute decompensated heart failure with severely elevated LVEDP and pulmonary capillary wedge pressure.  Patient's dyspnea has improved significantly compared to yesterday.  His BNP is elevated at 647, and on exam he continues to have rales noted in his bilateral lower lung fields, however he is otherwise not significantly volume overloaded on exam.  Yesterday patient was started on carvedilol, furosemide, spironolactone, and Entresto.  Renal function has remained stable and patient is tolerating medications well.  Will increase Entresto from 24/26 mg to 49/51 mg twice daily.  Will transition from furosemide 20 mg IV 3 times daily to 40 mg orally twice daily.  We will continue carvedilol, and spironolactone.  Patient started on nitroglycerin drip following cardiac catheterization yesterday.  His blood pressure is soft this morning and he has had no chest pain, therefore will discontinue nitroglycerin infusion at this time.  2. Dilated  cardiomyopathy  Patient's echocardiogram February 2022 revealed dilated cardiomyopathy with LVEF 15-20%.  Findings during cardiac catheterization yesterday suggest ischemic etiology of cardiomyopathy.  We will continue titration of guideline directed medical therapy for heart failure with reduced ejection fraction.  Patient has LAD lesion which appears amenable to revascularization, however recommended by Dr. Einar Gip the patient abuse stabilized from a heart failure standpoint prior to undergoing repeat cardiac catheterization with revascularization of LAD.  3. Coronary artery disease  Will continue guideline directed medical therapy including Entresto, rosuvastatin, and carvedilol.  Patient had coronary calcium score earlier this month revealing 12 score a 54, 96th percentile.  4. Dyspnea  Patient reports dyspnea has significantly improved since yesterday.  We will continue management of acute heart failure.  5. Hypercholesterolemia  Continue rosuvastatin.  Patient was seen in collaboration with Dr. Einar Gip. He also reviewed patient's chart and examined the patient. Dr. Einar Gip is in agreement of the plan.     Alethia Berthold, PA-C 05/30/2020, 1:24 PM Office: [phone]

## 2020-05-31 ENCOUNTER — Inpatient Hospital Stay (HOSPITAL_COMMUNITY)
Admission: AD | Disposition: A | Discharge status: Home / Self Care | Payer: Self-pay | Source: Home / Self Care | Attending: Cardiology

## 2020-05-31 DIAGNOSIS — I2510 Atherosclerotic heart disease of native coronary artery without angina pectoris: Secondary | ICD-10-CM

## 2020-05-31 HISTORY — PX: LEFT HEART CATH: CATH118248

## 2020-05-31 HISTORY — PX: CORONARY STENT INTERVENTION: CATH118234

## 2020-05-31 LAB — BASIC METABOLIC PANEL
Anion gap: 11 (ref 5–15)
BUN: 26 mg/dL — ABNORMAL HIGH (ref 6–20)
CO2: 25 mmol/L (ref 22–32)
Calcium: 8.7 mg/dL — ABNORMAL LOW (ref 8.9–10.3)
Chloride: 96 mmol/L — ABNORMAL LOW (ref 98–111)
Creatinine, Ser: 1.1 mg/dL (ref 0.61–1.24)
GFR, Estimated: >60 mL/min (ref >60)
Glucose, Bld: 151 mg/dL — ABNORMAL HIGH (ref 70–99)
Potassium: 4 mmol/L (ref 3.5–5.1)
Sodium: 132 mmol/L — ABNORMAL LOW (ref 135–145)

## 2020-05-31 LAB — POCT ACTIVATED CLOTTING TIME: Activated Clotting Time: 315 seconds

## 2020-05-31 LAB — PROTIME-INR
INR: 0.9 (ref 0.8–1.2)
Prothrombin Time: 12 seconds (ref 11.4–15.2)

## 2020-05-31 LAB — BRAIN NATRIURETIC PEPTIDE: B Natriuretic Peptide: 292.5 pg/mL — ABNORMAL HIGH (ref 0.0–100.0)

## 2020-05-31 SURGERY — CORONARY STENT INTERVENTION
Anesthesia: LOCAL

## 2020-05-31 MED ORDER — HEPARIN (PORCINE) IN NACL 1000-0.9 UT/500ML-% IV SOLN
INTRAVENOUS | Status: AC
  Filled 2020-05-31: qty 1500

## 2020-05-31 MED ORDER — CLOPIDOGREL BISULFATE 75 MG PO TABS
75.0000 mg | ORAL_TABLET | Freq: Every day | ORAL | Status: DC
  Administered 2020-06-01 – 2020-06-02 (×2): 75 mg via ORAL
  Filled 2020-05-31 (×2): qty 1

## 2020-05-31 MED ORDER — DIGOXIN 250 MCG PO TABS
0.2500 mg | ORAL_TABLET | Freq: Every day | ORAL | Status: DC
  Administered 2020-05-31 – 2020-06-02 (×3): 0.25 mg via ORAL
  Filled 2020-05-31 (×3): qty 1

## 2020-05-31 MED ORDER — HEPARIN SODIUM (PORCINE) 1000 UNIT/ML IJ SOLN
INTRAMUSCULAR | Status: AC
  Filled 2020-05-31: qty 1

## 2020-05-31 MED ORDER — HEPARIN SODIUM (PORCINE) 1000 UNIT/ML IJ SOLN
INTRAMUSCULAR | Status: DC | PRN
  Administered 2020-05-31: 8000 [IU] via INTRAVENOUS

## 2020-05-31 MED ORDER — CLOPIDOGREL BISULFATE 75 MG PO TABS
600.0000 mg | ORAL_TABLET | Freq: Once | ORAL | Status: AC
  Administered 2020-05-31: 600 mg via ORAL
  Filled 2020-05-31: qty 8

## 2020-05-31 MED ORDER — SODIUM CHLORIDE 0.9% FLUSH: 3.0000 mL | INTRAVENOUS | Status: DC | PRN

## 2020-05-31 MED ORDER — SODIUM CHLORIDE 0.9 % IV SOLN: 250.0000 mL | INTRAVENOUS | Status: DC | PRN

## 2020-05-31 MED ORDER — LIDOCAINE HCL (PF) 1 % IJ SOLN
INTRAMUSCULAR | Status: DC | PRN
  Administered 2020-05-31: 2 mL

## 2020-05-31 MED ORDER — MIDAZOLAM HCL 2 MG/2ML IJ SOLN
INTRAMUSCULAR | Status: AC
  Filled 2020-05-31: qty 2

## 2020-05-31 MED ORDER — SODIUM CHLORIDE 0.9% FLUSH
3.0000 mL | Freq: Two times a day (BID) | INTRAVENOUS | Status: DC
  Administered 2020-06-01 – 2020-06-02 (×2): 3 mL via INTRAVENOUS

## 2020-05-31 MED ORDER — FENTANYL CITRATE (PF) 100 MCG/2ML IJ SOLN
INTRAMUSCULAR | Status: DC | PRN
  Administered 2020-05-31: 25 ug via INTRAVENOUS

## 2020-05-31 MED ORDER — SODIUM CHLORIDE 0.9 % IV BOLUS
INTRAVENOUS | Status: AC | PRN
  Administered 2020-05-31: 100 mL via INTRAVENOUS

## 2020-05-31 MED ORDER — IOHEXOL 350 MG/ML SOLN
INTRAVENOUS | Status: DC | PRN
  Administered 2020-05-31: 50 mL

## 2020-05-31 MED ORDER — FENTANYL CITRATE (PF) 100 MCG/2ML IJ SOLN
INTRAMUSCULAR | Status: AC
  Filled 2020-05-31: qty 2

## 2020-05-31 MED ORDER — SODIUM CHLORIDE 0.9 % IV SOLN: INTRAVENOUS | Status: AC

## 2020-05-31 MED ORDER — NITROGLYCERIN 1 MG/10 ML FOR IR/CATH LAB
INTRA_ARTERIAL | Status: AC
  Filled 2020-05-31: qty 10

## 2020-05-31 MED ORDER — IOHEXOL 350 MG/ML SOLN
INTRAVENOUS | Status: AC
  Filled 2020-05-31: qty 1

## 2020-05-31 MED ORDER — NITROGLYCERIN 1 MG/10 ML FOR IR/CATH LAB
INTRA_ARTERIAL | Status: DC | PRN
  Administered 2020-05-31: 50 ug via INTRACORONARY

## 2020-05-31 MED ORDER — SODIUM CHLORIDE 0.9% FLUSH
3.0000 mL | Freq: Two times a day (BID) | INTRAVENOUS | Status: DC
  Administered 2020-06-01 – 2020-06-02 (×3): 3 mL via INTRAVENOUS

## 2020-05-31 MED ORDER — SODIUM CHLORIDE 0.9 % IV SOLN: INTRAVENOUS | Status: DC

## 2020-05-31 MED ORDER — MIDAZOLAM HCL 2 MG/2ML IJ SOLN
INTRAMUSCULAR | Status: DC | PRN
  Administered 2020-05-31: 2 mg via INTRAVENOUS

## 2020-05-31 MED ORDER — LIDOCAINE HCL (PF) 1 % IJ SOLN
INTRAMUSCULAR | Status: AC
  Filled 2020-05-31: qty 30

## 2020-05-31 MED ORDER — VERAPAMIL HCL 2.5 MG/ML IV SOLN
INTRAVENOUS | Status: AC
  Filled 2020-05-31: qty 2

## 2020-05-31 SURGICAL SUPPLY — 19 items
BALLN SAPPHIRE 2.5X12 (BALLOONS) ×2
BALLN SAPPHIRE 2.5X15 (BALLOONS) ×2
BALLN SAPPHIRE ~~LOC~~ 3.5X10 (BALLOONS) ×1 IMPLANT
BALLOON SAPPHIRE 2.5X12 (BALLOONS) IMPLANT
BALLOON SAPPHIRE 2.5X15 (BALLOONS) IMPLANT
CATH INFINITI 5FR ANG PIGTAIL (CATHETERS) ×1 IMPLANT
CATH VISTA GUIDE 6FR XB3.5 (CATHETERS) ×1 IMPLANT
DEVICE RAD COMP TR BAND LRG (VASCULAR PRODUCTS) ×1 IMPLANT
GLIDESHEATH SLEND A-KIT 6F 22G (SHEATH) ×1 IMPLANT
GUIDEWIRE INQWIRE 1.5J.035X260 (WIRE) IMPLANT
INQWIRE 1.5J .035X260CM (WIRE) ×2
KIT ENCORE 40 (KITS) ×1 IMPLANT
KIT HEART LEFT (KITS) ×2 IMPLANT
PACK CARDIAC CATHETERIZATION (CUSTOM PROCEDURE TRAY) ×2 IMPLANT
STENT RESOLUTE ONYX 3.0X12 (Permanent Stent) ×1 IMPLANT
STENT RESOLUTE ONYX 3.5X18 (Permanent Stent) ×1 IMPLANT
TRANSDUCER W/STOPCOCK (MISCELLANEOUS) ×2 IMPLANT
TUBING CIL FLEX 10 FLL-RA (TUBING) ×2 IMPLANT
WIRE COUGAR XT STRL 190CM (WIRE) ×1 IMPLANT

## 2020-05-31 NOTE — Progress Notes (Signed)
Subjective:  She is seen and examined at the bedside at approximately 8 AM this morning.  He is resting comfortably.  He continues to complain of nausea and loss of appetite.  However overall he continues to feel better.  Denies chest pain, palpitations, orthopnea, PND, leg swelling.  Patient reports no dyspnea, however he has been active within his room.  His blood pressure has improved and he is tolerating guideline directed medical therapy without issue.  He continues to have good urinary output.  Over the last 24 hours he is only needed supplemental oxygen for short period of time, via nasal cannula at 1 L/min.  Patient's BNP has trended down and renal function has remain stable.   Intake/Output from previous day:  I/O last 3 completed shifts: In: 1523.2 [P.O.:1020; I.V.:503.2] Out: 975 [Urine:975] Total I/O In: 355 [P.O.:355] Out: 320 [Urine:320]  Blood pressure (!) 122/93, pulse 84, temperature 98.1 F (36.7 C), temperature source Oral, resp. rate 18, height 6' (1.829 m), weight 71.8 kg, SpO2 98 %. Physical Exam Constitutional:      General: He is not in acute distress.    Appearance: He is normal weight. He is not diaphoretic.  HENT:     Head: Normocephalic and atraumatic.     Mouth/Throat:     Mouth: Mucous membranes are moist.  Eyes:     Extraocular Movements: Extraocular movements intact.     Conjunctiva/sclera: Conjunctivae normal.  Neck:     Vascular: No carotid bruit or JVD.  Cardiovascular:     Rate and Rhythm: Normal rate and regular rhythm.     Pulses:          Carotid pulses are 2+ on the right side and 2+ on the left side.      Radial pulses are 2+ on the right side and 2+ on the left side.       Femoral pulses are 2+ on the right side and 2+ on the left side.      Popliteal pulses are 2+ on the right side and 2+ on the left side.       Dorsalis pedis pulses are 2+ on the right side and 2+ on the left side.       Posterior tibial pulses are 1+ on the right  side and 1+ on the left side.     Heart sounds: S1 normal and S2 normal. No murmur heard.   Pulmonary:     Effort: Pulmonary effort is normal.     Breath sounds: Rales (Bilateral lower bases) present. No wheezing or rhonchi.  Musculoskeletal:     Cervical back: Normal range of motion and neck supple.     Right lower leg: No edema.     Left lower leg: No edema.  Skin:    General: Skin is warm and dry.  Neurological:     General: No focal deficit present.     Mental Status: He is alert and oriented to person, place, and time.     Lab Results: BMP BNP (last 3 results) Recent Labs    05/30/20 0337 05/31/20 0857  BNP 647.6* 292.5*    ProBNP (last 3 results) No results for input(s): PROBNP in the last 8760 hours. BMP Latest Ref Rng & Units 05/31/2020 05/30/2020 05/29/2020  Glucose 70 - 99 mg/dL 151(H) 132(H) -  BUN 6 - 20 mg/dL 26(H) 18 -  Creatinine 0.61 - 1.24 mg/dL 1.10 1.10 1.16  BUN/Creat Ratio 9 - 20 - - -  Sodium  135 - 145 mmol/L 132(L) 135 -  Potassium 3.5 - 5.1 mmol/L 4.0 4.0 -  Chloride 98 - 111 mmol/L 96(L) 98 -  CO2 22 - 32 mmol/L 25 24 -  Calcium 8.9 - 10.3 mg/dL 8.7(L) 9.3 -   Hepatic Function Latest Ref Rng & Units 08/13/2013  Total Protein 6.0 - 8.3 g/dL 7.6  Albumin 3.5 - 5.2 g/dL 4.4  AST 0 - 37 U/L 20  ALT 0 - 53 U/L 24  Alk Phosphatase 39 - 117 U/L 114  Total Bilirubin 0.3 - 1.2 mg/dL 0.8   CBC Latest Ref Rng & Units 05/30/2020 05/29/2020 05/29/2020  WBC 4.0 - 10.5 K/uL 10.9(H) 10.9(H) -  Hemoglobin 13.0 - 17.0 g/dL 16.8 16.6 15.3  Hematocrit 39.0 - 52.0 % 46.9 46.3 45.0  Platelets 150 - 400 K/uL 254 274 -   Lipid Panel  No results found for: CHOL, TRIG, HDL, CHOLHDL, VLDL, LDLCALC, LDLDIRECT Cardiac Panel (last 3 results) No results for input(s): CKTOTAL, CKMB, TROPONINI, RELINDX in the last 72 hours.  HEMOGLOBIN A1C No results found for: HGBA1C, MPG TSH No results for input(s): TSH in the last 8760 hours.  Imaging: DG CHEST PORT 1 VIEW  05/29/2020 The heart size and mediastinal contours are unchanged. No focal consolidation. No pulmonary edema. No pleural effusion. No pneumothorax. No acute osseous abnormality.  IMPRESSION: No active disease.  Cardiac Studies: CT cardiac scoring 05/09/2020: Left Main: 0 LAD: 623 LCx: 228 RCA: 3.4 Total Agatston Score: 854 MESA database percentile: 96 AORTA MEASUREMENTS: Ascending Aorta: 28 mm Descending Aorta: 24 mm OTHER FINDINGS: Heart is normal size. Aorta normal caliber. No adenopathy. No confluent opacities or effusions. Imaging into the upper abdomen demonstrates no acute findings. Chest wall soft tissues are unremarkable. No acute bony abnormality.  IMPRESSION: Total Agatston score: Garvin percentile: 96 No acute or significant extracardiac abnormality.  PCV ECHOCARDIOGRAM COMPLETE 05/04/2020 Left ventricle cavity is severely dilated. Normal left ventricular wall thickness. Severe global hypokinesis, LVEF 15-20%. Diastolic function not assessed due to severity of mitral regurgitation. Spontaneous echocontrast seen in LV cavity, without evidence of thrombus on this non-contrast study. Left atrial cavity is severely dilated. Severe posteriorly directed mitral regurgitation. Moderate tricuspid regurgitation. Estimated pulmonary artery systolic pressure 43 mmHg.   PCV MYOCARDIAL PERFUSION WITH LEXISCAN 05/02/2020 Lexiscan nuclear stress test performed using 1-day protocol. SPECT images show large LV cavity with global decrease in counts. In addition, there is medium sized, severe intensity, predominantly fixed perfusion defect in anterior apical myocardium. Global decrease in wall motion and thickening. Stress LVEF 13%. High risk study.  CARDIAC CATHETERIZATION 05/29/2020:  Right and left heart catheterization 05/29/20 RA 14/15, mean 11 mmHg. RV 61/7, EDP 14 mmHg. PA 63/27, mean 48 mmHg.  PA saturation 68%. PW 37/34, mean 32 mmHg.  Aortic saturation 98%.  Cardiac output 4.28, cardiac index 2.13 by Fick. LV: 134/24, EDP 37 mmHg.  There is no pressure gradient across the aortic valve. LM: Large vessel, has mild calcification. LAD: Proximal segment at septal perforator 1 has a 70 to 80% stenosis.  Following this there is 100% occlusion with ipsilateral and bridging collaterals to the LAD, short segment occlusion.  Large D1 and D2.  Diffuse mild coronary calcification is evident. CX: Large caliber vessel.  Gives origin to 2 large marginals.  Mild diffuse disease. RCA: Large-caliber vessel.  Mild disease is present.   Impression: Patient is in acute decompensated heart failure with severely elevated LVEDP and pulmonary capillary wedge pressure.  Patient is  also diaphoretic and has not been feeling well for the last few days.  He will need to be admitted to the hospital for diuresis and stabilization.  LAD is very short segment occlusion and appears to be amenable for revascularization, in view of very young age, still worthwhile to proceed with angioplasty to see whether there will be remodeling of his LV with revascularization when stable hemodynamically.  30 mL contrast utilized.  EKG:  EKG 05/29/2020: Sinus rhythm with frequent PVCs at a rate 68 bpm.  Normal axis.  Poor R wave progression, cannot exclude anteroseptal infarct.  EKG 04/13/2020: Sinus tachycardia at rate of 103 bpm, biatrial enlargement, left bundle branch block.  PVCs (2).  No further analysis.  No significant change from 02/16/2020.   No results found for this or any previous visit (from the past 43800 hour(s)).  Scheduled Meds: . ALPRAZolam  0.5 mg Oral TID  . aspirin  81 mg Oral Daily  . carvedilol  3.125 mg Oral BID WC  . diazepam  2 mg Oral TID  . enoxaparin (LOVENOX) injection  40 mg Subcutaneous Q24H  . furosemide  40 mg Oral BID  . potassium chloride  10 mEq Oral TID  . rosuvastatin  20 mg Oral Daily  . sacubitril-valsartan  1 tablet Oral BID  . sodium chloride flush  3 mL  Intravenous Q12H  . sodium chloride flush  3 mL Intravenous Q12H  . sodium chloride flush  3 mL Intravenous Q12H  . spironolactone  12.5 mg Oral Daily   Continuous Infusions: . sodium chloride 10 mL/hr at 05/29/20 0815  . sodium chloride    . sodium chloride    . sodium chloride 30 mL/hr at 05/31/20 1015   PRN Meds:.sodium chloride, sodium chloride, acetaminophen, albuterol, ondansetron (ZOFRAN) IV, sodium chloride flush, sodium chloride flush  Assessment/Plan:  1. Acute decompensated systolic heart failure Patient blood pressure has not been soft.  He is tolerating guideline directed therapy well, will continue Entresto, carvedilol, spironolactone.  We will also continue furosemide 40 mg p.o. twice daily.  BNP has trended down from 647.  Renal function has remained stable.  Patient appears euvolemic on exam.  Will continue to monitor daily weights, strict I&O's, BMP, and BNP.  2. Dilated cardiomyopathy  Patient has severe mitral regurgitation, suspect likely secondary to dilated cardiomyopathy, however unclear if mitral vegetation led to heart failure or heart failure led to left ventricular LV dilation and subsequent MR.  Patient will likely not tolerate mitral valve surgery in view of severe LV systolic dysfunction.  Continue with above guideline directed medical therapy.  3. Coronary artery disease  Patient is well compensated from heart failure standpoint, and presently doing well.  He is tolerating medications without issue and blood pressure is improved.  We will plan to proceed with PCI to the LAD later today with Dr. Einar Gip.  We again discussed regarding risks, benefits, alternatives, patient wants to proceed.   4. Dyspnea  Patient reports no dyspnea at this. He has required less supplemental oxygen over the last 24 hours. Only requiring 1L/min for a short time via nasal cannula.   5. Hypercholesterolemia  Continue rosuvastatin.  Patient was seen in collaboration with Dr.  Einar Gip. He also reviewed patient's chart and examined the patient. Dr. Einar Gip is in agreement of the plan.    Alethia Berthold, PA-C 05/31/2020, 1:02 PM Office: [phone]

## 2020-05-31 NOTE — Progress Notes (Signed)
Pt arrived from cath lab. Right radial site level 0. BP Stable.   Upon reassessment Pt BP 68/54 and hematoma on right radial site. Jacinto Halim, MD notified. Pressure held to right radial site. Verbal order per Jacinto Halim, MD administer bolus.

## 2020-05-31 NOTE — Progress Notes (Signed)
Heart Failure Nurse Navigator Progress Note  PCP: Roderick Pee, PA PCP-Cardiologist: Jeanella Cara., MD Admission Diagnosis: acute CHF Admitted from: home alone    Presentation:   Jermaine Morris presented 3/22 for elective R/L [facility]  With Dr. Jacinto Halim. Admitted for decompensating HF, IV lasix. Then repeat cath 3/24 for PCI. Pt sitting on side of bed on room air, with no distress noted. Pt very interactive with interview screening process. Pt stated he hasn't been going to doctors or taking care of himself as he prefers to "do things holistically" and works out everyday-150 push ups and then weight training. Instructed pt that these medications are imperative to take for his heart to get stronger. Encouraged continued cessation of nicotine packets/pounches.  After education pt asked when he could go back to work-confirmed with cardiology PA [facility]), pt is not to go back to work until after seen and cleared by gen cards.  ECHO/ LVEF: 04/2020 15-20%  Clinical Course:  Past Medical History:  Diagnosis Date  . COPD (chronic obstructive pulmonary disease) (HCC)   . Hyperlipidemia      Social History   Socioeconomic History  . Marital status: Legally Separated    Spouse name: Not on file  . Number of children: 2  . Years of education: 56  . Highest education level: High school graduate  Occupational History  . Occupation: Nurse, mental health: [employer]  Tobacco Use  . Smoking status: Former Smoker    Packs/day: 1.50    Years: 30.00    Pack years: 45.00    Types: Cigarettes    Quit date: 12/2019    Years since quitting: 0.4  . Smokeless tobacco: Former Neurosurgeon    Types: Snuff  . Tobacco comment: Oct 2021, recently quit Chew also. Nicotine pouches-enoucraged to quit.    Vaping Use  . Vaping Use: Never used  Substance and Sexual Activity  . Alcohol use: Yes    Alcohol/week: 4.0 standard drinks    Types: 4 Shots of liquor per week    Comment: occ. social on weekends.  . Drug  use: Yes    Frequency: 7.0 times per week    Types: Marijuana  . Sexual activity: Not on file  Other Topics Concern  . Not on file  Social History Narrative  . Not on file   Social Determinants of Health   Financial Resource Strain: Low Risk   . Difficulty of Paying Living Expenses: Not hard at all  Food Insecurity: No Food Insecurity  . Worried About Programme researcher, broadcasting/film/video in the Last Year: Never true  . Ran Out of Food in the Last Year: Never true  Transportation Needs: No Transportation Needs  . Lack of Transportation (Medical): No  . Lack of Transportation (Non-Medical): No  Physical Activity: Sufficiently Active  . Days of Exercise per Week: 7 days  . Minutes of Exercise per Session: 50 min  Stress: Not on file  Social Connections: Not on file    High Risk Criteria for Readmission and/or Poor Patient Outcomes:  Heart failure hospital admissions (last 6 months): 1   No Show rate: 0%  Difficult social situation: no  Demonstrates medication adherence: no  Primary Language: Enlgish  Literacy level: able to read/write and comprehend  Education Assessment and Provision:  Detailed education and instructions provided on heart failure disease management including the following:  Signs and symptoms of Heart Failure When to call the physician Importance of daily weights Low sodium diet  Fluid restriction Medication management Anticipated future follow-up appointments  Patient education given on each of the above topics.  Patient acknowledges understanding via teach back method and acceptance of all instructions.  Education Materials:  "Living Better With Heart Failure" Booklet, HF zone tool, & Daily Weight Tracker Tool.  Patient has scale at home: no, pt plans to pick up scale when getting meds. Patient has pill box at home: no, given from AHF clinic.   Barriers of Care:   -none  Considerations/Referrals:   Referral made to Heart Failure Pharmacist Stewardship:  yes, at bedside Referral made to Heart & Vascular TOC clinic: yes, 3/29 @ 3pm. Transportation confirmed.  Items for Follow-up on DC/TOC: - optimize: had to decrease/hold entresto during hospitalization d/t drop in BP and MAP low 60s. -education -continue nicotine cessation  Ozella Rocks, RN, BSN Heart Failure Nurse Navigator [phone]

## 2020-05-31 NOTE — Interval H&P Note (Signed)
Cath Lab Visit (complete for each Cath Lab visit)  Clinical Evaluation Leading to the Procedure:   ACS: Yes.    Non-ACS:    Anginal Classification: CCS IV  Anti-ischemic medical therapy: Minimal Therapy (1 class of medications)  Non-Invasive Test Results: High-risk stress test findings: cardiac mortality >3%/year  Prior CABG: No previous CABG  History and Physical Interval Note:  05/31/2020 3:14 PM  Jermaine Morris  has presented today for surgery, with the diagnosis of cad.  The various methods of treatment have been discussed with the patient and family. After consideration of risks, benefits and other options for treatment, the patient has consented to  Procedure(s): CORONARY STENT INTERVENTION (N/A) as a surgical intervention.  The patient's history has been reviewed, patient examined, no change in status, stable for surgery.  I have reviewed the patient's chart and labs.  Questions were answered to the patient's satisfaction.     Yates Decamp

## 2020-05-31 NOTE — Progress Notes (Signed)
Received patient from cath lab approx. 1645.  At approx 1715, right forearm noted to be swollen and tight from possible hematoma. Dr. Jacinto Halim responded to page quickly and gave order to place BP cuff on forearm and inflate to 40 psi, leaving it in place for 2 hours (I was not able to place order in chart, as I attempted to do it beyond the Epic allowable time interval ).Arm monitored frequently until shift end at 1900.  Radial pulse, color, temperature, and capillary refill all remained wnl.

## 2020-05-31 NOTE — Progress Notes (Signed)
Heart Failure Stewardship Pharmacist Progress Note   PCP: Roderick Pee, PA PCP-Cardiologist: No primary care provider on file.    HPI:  60 YO male with PMH of COPD, interstitial lung disease, GERD, tobacco use, HLD. Recently established care with Dr. Yates Decamp for dyspnea on exertion. Since then cardiac workup completed showing EF of 15-20%. Presented 05/29/20 for R/L heart cath and coronary angiography and was admitted for acute decompensated heart failure requiring diuresis. Physical exam represents respiratory distress, severe orthopnea. Cath showed 100% stenosis in LAD, stent placement scheduled for today.   Current HF Medications: Furosemide 40 mg PO BID Carvedilol 3.125 mg BID Entresto 24-26 mg BID Spironolactone 12.5 mg daily   Prior to admission HF Medications: None  Pertinent Lab Values: . Serum creatinine 1.10, BUN 26, Potassium 4.0, Sodium 132, BNP 647 > 292, Magnesium 2.2   Vital Signs: . Weight: 158 lbs (admission weight: 159 lbs) . Blood pressure: 120-130/80-90s (today), yesterday BP soft 90-100s/60-70s . Heart rate: 70s  Medication Assistance / Insurance Benefits Check: Does the patient have prescription insurance?  Yes Type of insurance plan: [employer] - commercial plan  Does the patient qualify for medication assistance through manufacturers or grants?  No  Outpatient Pharmacy:  Prior to admission outpatient [facility] Is the patient willing to use [facility] TOC pharmacy at discharge? Pending Is the patient willing to transition their outpatient pharmacy to utilize a [facility] outpatient pharmacy?   Pending    Assessment: 1. Acute on chronic systolic CHF (EF 23-53%), due to CAD. NYHA class II/III symptoms. Stent placement scheduled today. - Continue furosemide 40 mg PO BID; diuresing well, orthopnea has improved greatly per PA's 05/30/20 note; renal function stable - Continue Entresto 24-26 mg BID; can consider increasing prior to discharge or at  outpatient visit as BP will allow - Continue carvedilol 3.125 mg BID; May consider increasing in the future as patient become euvolemic and HR/BP allows - Continue Spironolactone 12.5 mg daily, recommend increasing to 25 mg tomorrow after completion of stent placement - Consider adding SGLT-2 prior to discharge    Plan: 1) Medication changes recommended at this time: - Consider increasing spironolactone to 25 mg tomorrow.   2) Patient assistance: - Prior authorization required for Entresto and Farxiga/Jardiance; will complete these today - Patient has Nurse, learning disability, can utilize monthly copay cards to help with cost  3)  Education  - To be completed prior to discharge   Theodis Sato, PharmD PGY-1 Community Pharmacy Resident  05/31/2020 12:10 PM  Sharen Hones, PharmD, BCPS Heart Failure Stewardship Pharmacist Phone [phone]

## 2020-05-31 NOTE — Progress Notes (Signed)
   05/31/20 1753  Assess: MEWS Score  Temp 98 F (36.7 C)  BP (!) 77/60  Pulse Rate 90  ECG Heart Rate 87  Resp 16  Level of Consciousness Alert  SpO2 96 %  O2 Device Room Air  Assess: MEWS Score  MEWS Temp 0  MEWS Systolic 2  MEWS Pulse 0  MEWS RR 0  MEWS LOC 0  MEWS Score 2  MEWS Score Color Yellow  Assess: if the MEWS score is Yellow or Red  Were vital signs taken at a resting state? Yes  Focused Assessment Change from prior assessment (see assessment flowsheet)  Early Detection of Sepsis Score *See Row Information* Low  MEWS guidelines implemented *See Row Information* Yes  Treat  MEWS Interventions Administered prn meds/treatments ( Bolus)  Pain Scale 0-10  Pain Score 0  Take Vital Signs  Increase Vital Sign Frequency  Yellow: Q 2hr X 2 then Q 4hr X 2, if remains yellow, continue Q 4hrs  Escalate  MEWS: Escalate Yellow: discuss with charge nurse/RN and consider discussing with provider and RRT  Notify: Charge Nurse/RN  Name of Charge Nurse/RN Notified Abigail, RN  Date Charge Nurse/RN Notified 05/31/20  Time Charge Nurse/RN Notified 1753  Notify: Provider  Provider Name/Title Jacinto Halim, MD  Date Provider Notified 05/31/20  Time Provider Notified 1753  Notification Type Call  Notification Reason Change in status  Provider response Other (Comment) (Verbal order bolus)  Date of Provider Response 05/31/20  Time of Provider Response 1753  Notify: Rapid Response  Name of Rapid Response RN Notified Hela  Date Rapid Response Notified 05/31/20  Time Rapid Response Notified 1753  Document  Patient Outcome Stabilized after interventions  Progress note created (see row info) Yes

## 2020-05-31 NOTE — H&P (View-Only) (Signed)
Subjective:  She is seen and examined at the bedside at approximately 8 AM this morning.  He is resting comfortably.  He continues to complain of nausea and loss of appetite.  However overall he continues to feel better.  Denies chest pain, palpitations, orthopnea, PND, leg swelling.  Patient reports no dyspnea, however he has been active within his room.  His blood pressure has improved and he is tolerating guideline directed medical therapy without issue.  He continues to have good urinary output.  Over the last 24 hours he is only needed supplemental oxygen for short period of time, via nasal cannula at 1 L/min.  Patient's BNP has trended down and renal function has remain stable.   Intake/Output from previous day:  I/O last 3 completed shifts: In: 1523.2 [P.O.:1020; I.V.:503.2] Out: 975 [Urine:975] Total I/O In: 355 [P.O.:355] Out: 320 [Urine:320]  Blood pressure (!) 122/93, pulse 84, temperature 98.1 F (36.7 C), temperature source Oral, resp. rate 18, height 6' (1.829 m), weight 71.8 kg, SpO2 98 %. Physical Exam Constitutional:      General: He is not in acute distress.    Appearance: He is normal weight. He is not diaphoretic.  HENT:     Head: Normocephalic and atraumatic.     Mouth/Throat:     Mouth: Mucous membranes are moist.  Eyes:     Extraocular Movements: Extraocular movements intact.     Conjunctiva/sclera: Conjunctivae normal.  Neck:     Vascular: No carotid bruit or JVD.  Cardiovascular:     Rate and Rhythm: Normal rate and regular rhythm.     Pulses:          Carotid pulses are 2+ on the right side and 2+ on the left side.      Radial pulses are 2+ on the right side and 2+ on the left side.       Femoral pulses are 2+ on the right side and 2+ on the left side.      Popliteal pulses are 2+ on the right side and 2+ on the left side.       Dorsalis pedis pulses are 2+ on the right side and 2+ on the left side.       Posterior tibial pulses are 1+ on the right  side and 1+ on the left side.     Heart sounds: S1 normal and S2 normal. No murmur heard.   Pulmonary:     Effort: Pulmonary effort is normal.     Breath sounds: Rales (Bilateral lower bases) present. No wheezing or rhonchi.  Musculoskeletal:     Cervical back: Normal range of motion and neck supple.     Right lower leg: No edema.     Left lower leg: No edema.  Skin:    General: Skin is warm and dry.  Neurological:     General: No focal deficit present.     Mental Status: He is alert and oriented to person, place, and time.     Lab Results: BMP BNP (last 3 results) Recent Labs    05/30/20 0337 05/31/20 0857  BNP 647.6* 292.5*    ProBNP (last 3 results) No results for input(s): PROBNP in the last 8760 hours. BMP Latest Ref Rng & Units 05/31/2020 05/30/2020 05/29/2020  Glucose 70 - 99 mg/dL 151(H) 132(H) -  BUN 6 - 20 mg/dL 26(H) 18 -  Creatinine 0.61 - 1.24 mg/dL 1.10 1.10 1.16  BUN/Creat Ratio 9 - 20 - - -  Sodium  135 - 145 mmol/L 132(L) 135 -  Potassium 3.5 - 5.1 mmol/L 4.0 4.0 -  Chloride 98 - 111 mmol/L 96(L) 98 -  CO2 22 - 32 mmol/L 25 24 -  Calcium 8.9 - 10.3 mg/dL 8.7(L) 9.3 -   Hepatic Function Latest Ref Rng & Units 08/13/2013  Total Protein 6.0 - 8.3 g/dL 7.6  Albumin 3.5 - 5.2 g/dL 4.4  AST 0 - 37 U/L 20  ALT 0 - 53 U/L 24  Alk Phosphatase 39 - 117 U/L 114  Total Bilirubin 0.3 - 1.2 mg/dL 0.8   CBC Latest Ref Rng & Units 05/30/2020 05/29/2020 05/29/2020  WBC 4.0 - 10.5 K/uL 10.9(H) 10.9(H) -  Hemoglobin 13.0 - 17.0 g/dL 16.8 16.6 15.3  Hematocrit 39.0 - 52.0 % 46.9 46.3 45.0  Platelets 150 - 400 K/uL 254 274 -   Lipid Panel  No results found for: CHOL, TRIG, HDL, CHOLHDL, VLDL, LDLCALC, LDLDIRECT Cardiac Panel (last 3 results) No results for input(s): CKTOTAL, CKMB, TROPONINI, RELINDX in the last 72 hours.  HEMOGLOBIN A1C No results found for: HGBA1C, MPG TSH No results for input(s): TSH in the last 8760 hours.  Imaging: DG CHEST PORT 1 VIEW  05/29/2020 The heart size and mediastinal contours are unchanged. No focal consolidation. No pulmonary edema. No pleural effusion. No pneumothorax. No acute osseous abnormality.  IMPRESSION: No active disease.  Cardiac Studies: CT cardiac scoring 05/09/2020: Left Main: 0 LAD: 623 LCx: 228 RCA: 3.4 Total Agatston Score: 854 MESA database percentile: 96 AORTA MEASUREMENTS: Ascending Aorta: 28 mm Descending Aorta: 24 mm OTHER FINDINGS: Heart is normal size. Aorta normal caliber. No adenopathy. No confluent opacities or effusions. Imaging into the upper abdomen demonstrates no acute findings. Chest wall soft tissues are unremarkable. No acute bony abnormality.  IMPRESSION: Total Agatston score: Garvin percentile: 96 No acute or significant extracardiac abnormality.  PCV ECHOCARDIOGRAM COMPLETE 05/04/2020 Left ventricle cavity is severely dilated. Normal left ventricular wall thickness. Severe global hypokinesis, LVEF 15-20%. Diastolic function not assessed due to severity of mitral regurgitation. Spontaneous echocontrast seen in LV cavity, without evidence of thrombus on this non-contrast study. Left atrial cavity is severely dilated. Severe posteriorly directed mitral regurgitation. Moderate tricuspid regurgitation. Estimated pulmonary artery systolic pressure 43 mmHg.   PCV MYOCARDIAL PERFUSION WITH LEXISCAN 05/02/2020 Lexiscan nuclear stress test performed using 1-day protocol. SPECT images show large LV cavity with global decrease in counts. In addition, there is medium sized, severe intensity, predominantly fixed perfusion defect in anterior apical myocardium. Global decrease in wall motion and thickening. Stress LVEF 13%. High risk study.  CARDIAC CATHETERIZATION 05/29/2020:  Right and left heart catheterization 05/29/20 RA 14/15, mean 11 mmHg. RV 61/7, EDP 14 mmHg. PA 63/27, mean 48 mmHg.  PA saturation 68%. PW 37/34, mean 32 mmHg.  Aortic saturation 98%.  Cardiac output 4.28, cardiac index 2.13 by Fick. LV: 134/24, EDP 37 mmHg.  There is no pressure gradient across the aortic valve. LM: Large vessel, has mild calcification. LAD: Proximal segment at septal perforator 1 has a 70 to 80% stenosis.  Following this there is 100% occlusion with ipsilateral and bridging collaterals to the LAD, short segment occlusion.  Large D1 and D2.  Diffuse mild coronary calcification is evident. CX: Large caliber vessel.  Gives origin to 2 large marginals.  Mild diffuse disease. RCA: Large-caliber vessel.  Mild disease is present.   Impression: Patient is in acute decompensated heart failure with severely elevated LVEDP and pulmonary capillary wedge pressure.  Patient is  also diaphoretic and has not been feeling well for the last few days.  He will need to be admitted to the hospital for diuresis and stabilization.  LAD is very short segment occlusion and appears to be amenable for revascularization, in view of very young age, still worthwhile to proceed with angioplasty to see whether there will be remodeling of his LV with revascularization when stable hemodynamically.  30 mL contrast utilized.  EKG:  EKG 05/29/2020: Sinus rhythm with frequent PVCs at a rate 68 bpm.  Normal axis.  Poor R wave progression, cannot exclude anteroseptal infarct.  EKG 04/13/2020: Sinus tachycardia at rate of 103 bpm, biatrial enlargement, left bundle branch block.  PVCs (2).  No further analysis.  No significant change from 02/16/2020.   No results found for this or any previous visit (from the past 43800 hour(s)).  Scheduled Meds: . ALPRAZolam  0.5 mg Oral TID  . aspirin  81 mg Oral Daily  . carvedilol  3.125 mg Oral BID WC  . diazepam  2 mg Oral TID  . enoxaparin (LOVENOX) injection  40 mg Subcutaneous Q24H  . furosemide  40 mg Oral BID  . potassium chloride  10 mEq Oral TID  . rosuvastatin  20 mg Oral Daily  . sacubitril-valsartan  1 tablet Oral BID  . sodium chloride flush  3 mL  Intravenous Q12H  . sodium chloride flush  3 mL Intravenous Q12H  . sodium chloride flush  3 mL Intravenous Q12H  . spironolactone  12.5 mg Oral Daily   Continuous Infusions: . sodium chloride 10 mL/hr at 05/29/20 0815  . sodium chloride    . sodium chloride    . sodium chloride 30 mL/hr at 05/31/20 1015   PRN Meds:.sodium chloride, sodium chloride, acetaminophen, albuterol, ondansetron (ZOFRAN) IV, sodium chloride flush, sodium chloride flush  Assessment/Plan:  1. Acute decompensated systolic heart failure Patient blood pressure has not been soft.  He is tolerating guideline directed therapy well, will continue Entresto, carvedilol, spironolactone.  We will also continue furosemide 40 mg p.o. twice daily.  BNP has trended down from 647.  Renal function has remained stable.  Patient appears euvolemic on exam.  Will continue to monitor daily weights, strict I&O's, BMP, and BNP.  2. Dilated cardiomyopathy  Patient has severe mitral regurgitation, suspect likely secondary to dilated cardiomyopathy, however unclear if mitral vegetation led to heart failure or heart failure led to left ventricular LV dilation and subsequent MR.  Patient will likely not tolerate mitral valve surgery in view of severe LV systolic dysfunction.  Continue with above guideline directed medical therapy.  3. Coronary artery disease  Patient is well compensated from heart failure standpoint, and presently doing well.  He is tolerating medications without issue and blood pressure is improved.  We will plan to proceed with PCI to the LAD later today with Dr. Einar Gip.  We again discussed regarding risks, benefits, alternatives, patient wants to proceed.   4. Dyspnea  Patient reports no dyspnea at this. He has required less supplemental oxygen over the last 24 hours. Only requiring 1L/min for a short time via nasal cannula.   5. Hypercholesterolemia  Continue rosuvastatin.  Patient was seen in collaboration with Dr.  Einar Gip. He also reviewed patient's chart and examined the patient. Dr. Einar Gip is in agreement of the plan.    Alethia Berthold, PA-C 05/31/2020, 1:02 PM Office: [phone]

## 2020-06-01 ENCOUNTER — Encounter (HOSPITAL_COMMUNITY): Payer: Self-pay | Admitting: Cardiology

## 2020-06-01 LAB — BASIC METABOLIC PANEL
Anion gap: 8 (ref 5–15)
BUN: 23 mg/dL — ABNORMAL HIGH (ref 6–20)
CO2: 26 mmol/L (ref 22–32)
Calcium: 8.4 mg/dL — ABNORMAL LOW (ref 8.9–10.3)
Chloride: 97 mmol/L — ABNORMAL LOW (ref 98–111)
Creatinine, Ser: 1.05 mg/dL (ref 0.61–1.24)
GFR, Estimated: >60 mL/min (ref >60)
Glucose, Bld: 145 mg/dL — ABNORMAL HIGH (ref 70–99)
Potassium: 3.4 mmol/L — ABNORMAL LOW (ref 3.5–5.1)
Sodium: 131 mmol/L — ABNORMAL LOW (ref 135–145)

## 2020-06-01 LAB — CBC
HCT: 47.6 % (ref 39.0–52.0)
Hemoglobin: 17.8 g/dL — ABNORMAL HIGH (ref 13.0–17.0)
MCH: 35 pg — ABNORMAL HIGH (ref 26.0–34.0)
MCHC: 37.4 g/dL — ABNORMAL HIGH (ref 30.0–36.0)
MCV: 93.7 fL (ref 80.0–100.0)
Platelets: 292 10*3/uL (ref 150–400)
RBC: 5.08 MIL/uL (ref 4.22–5.81)
RDW: 11.9 % (ref 11.5–15.5)
WBC: 12.4 10*3/uL — ABNORMAL HIGH (ref 4.0–10.5)
nRBC: 0 % (ref 0.0–0.2)

## 2020-06-01 LAB — BRAIN NATRIURETIC PEPTIDE: B Natriuretic Peptide: 315 pg/mL — ABNORMAL HIGH (ref 0.0–100.0)

## 2020-06-01 LAB — GLUCOSE, CAPILLARY: Glucose-Capillary: 198 mg/dL — ABNORMAL HIGH (ref 70–99)

## 2020-06-01 MED ORDER — SACUBITRIL-VALSARTAN 24-26 MG PO TABS: 1.0000 | ORAL_TABLET | Freq: Two times a day (BID) | ORAL | 3 refills | Status: DC

## 2020-06-01 MED ORDER — FAMOTIDINE 20 MG PO TABS
20.0000 mg | ORAL_TABLET | Freq: Two times a day (BID) | ORAL | Status: DC
  Administered 2020-06-01 – 2020-06-02 (×3): 20 mg via ORAL
  Filled 2020-06-01 (×3): qty 1

## 2020-06-01 MED ORDER — SODIUM CHLORIDE 0.9 % IV SOLN
25.0000 mg | Freq: Four times a day (QID) | INTRAVENOUS | Status: DC | PRN
  Filled 2020-06-01 (×2): qty 1

## 2020-06-01 MED ORDER — FAMOTIDINE 20 MG PO TABS: 20.0000 mg | ORAL_TABLET | Freq: Two times a day (BID) | ORAL | 3 refills | Status: DC

## 2020-06-01 MED ORDER — FAMOTIDINE IN NACL 20-0.9 MG/50ML-% IV SOLN
20.0000 mg | Freq: Once | INTRAVENOUS | Status: AC
  Filled 2020-06-01: qty 50

## 2020-06-01 MED ORDER — CARVEDILOL 3.125 MG PO TABS: 3.1250 mg | ORAL_TABLET | Freq: Two times a day (BID) | ORAL | 3 refills | Status: DC

## 2020-06-01 MED ORDER — DIGOXIN 250 MCG PO TABS: 0.2500 mg | ORAL_TABLET | Freq: Every day | ORAL | 3 refills | Status: DC

## 2020-06-01 MED ORDER — CLOPIDOGREL BISULFATE 75 MG PO TABS: 75.0000 mg | ORAL_TABLET | Freq: Every day | ORAL | 3 refills | Status: DC

## 2020-06-01 MED ORDER — SPIRONOLACTONE 25 MG PO TABS: 12.5000 mg | ORAL_TABLET | Freq: Every day | ORAL | 3 refills | Status: DC

## 2020-06-01 MED FILL — Verapamil HCl IV Soln 2.5 MG/ML: INTRAVENOUS | Qty: 2 | Status: AC

## 2020-06-01 MED FILL — Heparin Sod (Porcine)-NaCl IV Soln 1000 Unit/500ML-0.9%: INTRAVENOUS | Qty: 1500 | Status: AC

## 2020-06-01 NOTE — Discharge Summary (Addendum)
Physician Discharge Summary  Patient ID: Jermaine Morris MRN: [id number] DOB/AGE: Jan 03, 1961 60 y.o. Jermaine Morris, [place]   Admit date: 05/29/2020 Discharge date: 06/01/2020  Primary Discharge Diagnosis 1.  Acute decompensated systolic heart failure 2.  Dilated cardiomyopathy 3.  Coronary artery disease, s/p PCI to proximal LAD  4.  Severe mitral regurgitation  Significant Diagnostic Studies:  Coronary angioplasty 05/31/2020: Successful stenting of the functionally occluded proximal LAD with implantation of a 3.5 x 18 mm resolute DES, due to edge dissection a 3.0 x 12 mm resolute Onyx DES was deployed.  Distal stent postdilated with the same stent balloon overlapping 2 stents at 16 atmospheric pressure and proximal stent postdilated with a 3.5 mm x 10 mm Sapphire Benjamin at 16 atmospheric pressure. Stenosis reduced from 100% to 0%, TIMI I flow to TIMI III flow at the end of the procedure.  50 mL contrast utilized.  Recommendation: Upon presentation to the Cath Lab, as patient received sedation his blood pressure was very soft.  Hence patient received IV bolus of 250 mL as his LVEDP was 0 mmHg.  Closing pressure 80/60 mmHg.  Patient completely asymptomatic and in no distress.  Will hold a.m. dose of Lasix and parameters on blood pressure medications.   Right and left heart catheterization 05/29/20  RA 14/15, mean 11 mmHg. RV 61/7, EDP 14 mmHg. PA 63/27, mean 48 mmHg.  PA saturation 68%. PW 37/34, mean 32 mmHg.  Aortic saturation 98%. Cardiac output 4.28, cardiac index 2.13 by Fick.  LV: 134/24, EDP 37 mmHg.  There is no pressure gradient across the aortic valve. LM: Large vessel, has mild calcification. LAD: Proximal segment at septal perforator 1 has a 70 to 80% stenosis.  Following this there is 100% occlusion with ipsilateral and bridging collaterals to the LAD, short segment occlusion.  Large D1 and D2.  Diffuse mild coronary calcification is evident. CX: Large caliber vessel.  Gives origin  to 2 large marginals.  Mild diffuse disease. RCA: Large-caliber vessel.  Mild disease is present.  Impression: Patient is in acute decompensated heart failure with severely elevated LVEDP and pulmonary capillary wedge pressure.  Patient is also diaphoretic and has not been feeling well for the last few days.  He will need to be admitted to the hospital for diuresis and stabilization.  LAD is very short segment occlusion and appears to be amenable for revascularization, inview of of very young age, still worthwhile to proceed with angioplasty to see whether there will be remodeling of his LV with revascularization when stable hemodynamically.  30 mL contrast utilized.  Radiology:  Chest x-ray 05/30/2018: The heart size and mediastinal contours are unchanged. No focal consolidation. No pulmonary edema. No pleural effusion. No pneumothorax. No acute osseous abnormality.  Hospital Course: Jermaine Morris is a 60 y.o. male patient who presented 05/29/2020 for elective cardiac catheterization which revealed occlusion to LAD, however patient was in acute decompensated heart failure with severely elevated LVEDP and pulmonary capillary wedge pressure.  Therefore revascularization of the LAD was postponed hence patient was admitted for diuresis and stabilization from a heart failure standpoint.  Patient was diuresed and started on guideline directed medical therapy including carvedilol, spironolactone, and Entresto.  Patient tolerated this well and was taken for repeat cardiac catheterization on 06/01/2019 at which time LVEDP was 0 and he underwent successful stenting to the proximal LAD.  Notably during procedure on 06/01/2019 patient became hypotensive with administration of sedation, therefore he was given fluid bolus.  Following repeat cardiac catheterization,  furosemide was discontinued and patient was maintained on spironolactone.   Discharge was held on 06/01/2020 due to hypotension. Sherryll Burger was discontinued  and can be restarted outpatient. I also discontinued digoxin. I emphasized importance of medication compliance on discharge, especially Aspirin and plavix, given recent PCI.   Did the patient have an acute coronary syndrome (MI, NSTEMI, STEMI, etc) this admission?: Yes                               AHA/ACC Clinical Performance & Quality Measures: 1. Aspirin prescribed? - Yes 2. ADP Receptor Inhibitor (Plavix/Clopidogrel, Brilinta/Ticagrelor or Effient/Prasugrel) prescribed (includes medically managed patients)? - Yes 3. Beta Blocker prescribed? - Yes 4. High Intensity Statin (Lipitor 40-80mg  or Crestor 20-40mg ) prescribed? - Yes 5. EF assessed during THIS hospitalization? - Yes 6. For EF <40%, was ACEI/ARB prescribed? - No - Reason:  Hypotension. Will start outpatient. 7. For EF <40%, Aldosterone Antagonist (Spironolactone or Eplerenone) prescribed? - Yes 8. Cardiac Rehab Phase II ordered (including medically managed patients)? - Yes    Discharge Exam: Vitals with BMI 06/01/2020 06/01/2020 05/31/2020  Height - - -  Weight - 159 lbs 5 oz -  BMI - 21.6 -  Systolic 124 117 83  Diastolic 83 86 62  Pulse 78 77 81     Physical Exam Vitals and nursing note reviewed.  Constitutional:      General: He is not in acute distress.    Appearance: He is not ill-appearing.  HENT:     Head: Normocephalic and atraumatic.     Mouth/Throat:     Mouth: Mucous membranes are moist.  Eyes:     Extraocular Movements: Extraocular movements intact.     Conjunctiva/sclera: Conjunctivae normal.  Cardiovascular:     Rate and Rhythm: Normal rate and regular rhythm.     Pulses: Intact distal pulses.     Heart sounds: S1 normal and S2 normal. No murmur heard. No gallop.   Pulmonary:     Effort: Pulmonary effort is normal. No respiratory distress.     Breath sounds: No wheezing, rhonchi or rales.  Abdominal:     General: Bowel sounds are normal. There is no distension.     Palpations: Abdomen is soft.   Musculoskeletal:     Cervical back: Normal range of motion and neck supple.     Right lower leg: No edema.     Left lower leg: No edema.  Skin:    General: Skin is warm and dry.  Neurological:     General: No focal deficit present.     Mental Status: He is alert and oriented to person, place, and time.  Psychiatric:        Mood and Affect: Mood normal.        Behavior: Behavior normal.     Labs:   Lab Results  Component Value Date   WBC 12.4 (H) 06/01/2020   HGB 17.8 (H) 06/01/2020   HCT 47.6 06/01/2020   MCV 93.7 06/01/2020   PLT 292 06/01/2020    Recent Labs  Lab 06/01/20 0311  NA 131*  K 3.4*  CL 97*  CO2 26  BUN 23*  CREATININE 1.05  CALCIUM 8.4*  GLUCOSE 145*    Lipid Panel  No results found for: CHOL, TRIG, HDL, CHOLHDL, VLDL, LDLCALC  BNP (last 3 results) Recent Labs    05/30/20 0337 05/31/20 0857 06/01/20 0311  BNP 647.6* 292.5* 315.0*  HEMOGLOBIN A1C No results found for: HGBA1C, MPG  Cardiac Panel  BNP    Component Value Date/Time   BNP 315.0 (H) 06/01/2020 0311    ProBNP No results found for: PROBNP  pnTSH No results for input(s): TSH in the last 8760 hours.  FOLLOW UP PLANS AND APPOINTMENTS Discharge Instructions    Amb Referral to Cardiac Rehabilitation   Complete by: As directed    Diagnosis: Coronary Stents   After initial evaluation and assessments completed: Virtual Based Care may be provided alone or in conjunction with Phase 2 Cardiac Rehab based on patient barriers.: Yes     Allergies as of 06/01/2020      Reactions   Codeine Nausea And Vomiting      Medication List    TAKE these medications   albuterol 108 (90 Base) MCG/ACT inhaler Commonly known as: VENTOLIN HFA Inhale 1-2 puffs into the lungs every 6 (six) hours as needed for shortness of breath or wheezing.   aspirin 81 MG chewable tablet Commonly known as: Aspirin Childrens Chew 1 tablet (81 mg total) by mouth daily.   carvedilol 3.125 MG  tablet Commonly known as: COREG Take 1 tablet (3.125 mg total) by mouth 2 (two) times daily with a meal.   clopidogrel 75 MG tablet Commonly known as: PLAVIX Take 1 tablet (75 mg total) by mouth daily with breakfast.   digoxin 0.25 MG tablet Commonly known as: LANOXIN Take 1 tablet (0.25 mg total) by mouth daily.   famotidine 20 MG tablet Commonly known as: PEPCID Take 1 tablet (20 mg total) by mouth 2 (two) times daily.   ibuprofen 200 MG tablet Commonly known as: ADVIL Take 400-800 mg by mouth every 6 (six) hours as needed for moderate pain.   rosuvastatin 20 MG tablet Commonly known as: CRESTOR Take 20 mg by mouth daily.   sacubitril-valsartan 24-26 MG Commonly known as: ENTRESTO Take 1 tablet by mouth 2 (two) times daily.   spironolactone 25 MG tablet Commonly known as: ALDACTONE Take 0.5 tablets (12.5 mg total) by mouth daily.        Elder Negus, MD Pager: [phone] Office: [phone]

## 2020-06-01 NOTE — Progress Notes (Signed)
Pt started to experience increased drowsiness. It was observed that pt was sleep frequently. Blood pressure checked. Systolic blood pressure readings have been lower 80's to 70's. Celeste, PA-C contacted and advised of blood pressure readings. Discharge discontinued and Entresto held per provider order. Pt updated of new orders and canceled discharge.

## 2020-06-01 NOTE — Progress Notes (Addendum)
Heart Failure Stewardship Pharmacist Progress Note   PCP: Roderick Pee, PA PCP-Cardiologist: No primary care provider on file.    HPI:  60 YO male with PMH of COPD, interstitial lung disease, GERD, tobacco use, HLD. Recently established care with Dr. Jacinto Halim as outpatient for dyspnea on exertion. Last ECHO was done on 05/05/20 and LVEF was 15-20%. Presented 05/29/20 for elective R/LHC and coronary angiography. Found to have 100% stenosis in LAD with bridging collaterals as well as elevated filling pressures (RA 11, PA 48, PCWP 32, CO 4.3, CI 2.1) and was admitted for acute decompensated heart failure requiring IV diuresis. Taken back to the cath lab on 05/31/20 for elective PCI.   Current HF Medications: Carvedilol 3.125 mg BID Entresto 24/26 mg BID Spironolactone 12.5 mg daily  Digoxin 0.25 mg daily  Prior to admission HF Medications: None  Pertinent Lab Values: . Serum creatinine 1.05, BUN 23, Potassium 3.4, Sodium 131, BNP 647>>315, Digoxin level due around 3/29  Vital Signs: . Weight: 159 lbs (admission weight: 159 lbs) . Blood pressure: 110/80s (hypotensive episodes during cath yesterday) . Heart rate: 70s  Medication Assistance / Insurance Benefits Check: Does the patient have prescription insurance?  Yes Type of insurance plan: [employer] - commercial plan  Does the patient qualify for medication assistance through manufacturers or grants?  No  Outpatient Pharmacy:  Prior to admission outpatient [facility] Is the patient willing to use [facility] TOC pharmacy at discharge? Pending Is the patient willing to transition their outpatient pharmacy to utilize a [facility] outpatient pharmacy?   Pending    Assessment: 1. Acute on chronic systolic CHF (EF 74-25%), due to ICM. NYHA class II symptoms. S/p PCI to LAD on 05/31/20. - Off furosemide due to hypotension during cath on 3/24 - Continue Entresto 24-26 mg BID - Continue carvedilol 3.125 mg BID - Continue spironolactone  12.5 mg daily, consider increasing to 25 mg daily pending BP improvement - Consider adding SGLT-2i at follow up visit - Continue digoxin 0.25 mg daily. Level due in about 1 week.   Plan: 1) Medication changes recommended at this time: - Discharge today  2) Patient assistance: - Prior authorization required for Entresto and Farxiga/Jardiance - Patient has Nurse, learning disability, can utilize monthly copay cards to help with cost  3) Education - Patient has been educated on current HF medications (carvedilol, Entresto, spironolactone, digoxin) and potential additions to HF medication regimen (Farxiga/Jardiance) - Patient verbalizes understanding that over the next few months, these medication doses may change and more medications may be added to optimize HF regimen - Patient has been educated on basic disease state pathophysiology and goals of therapy - Time spent (30 min)   Sharen Hones, PharmD, BCPS Heart Failure Stewardship Pharmacist Phone [phone]

## 2020-06-01 NOTE — Progress Notes (Signed)
[phone] CARDIAC REHAB PHASE I   PRE:  Rate/Rhythm: 75 SR  BP:  Supine:   Sitting: 124/83  Standing:    SaO2: 97%RA  MODE:  Ambulation: 470 ft   POST:  Rate/Rhythm: 87 SR  BP:  Supine: 12/76  Sitting:   Standing:    SaO2: 97%RA 0808-0910 Pt walked 470 ft on RA with steady gait and no CP. Tolerated well. Still with hiccups. Discussed importance of plavix with stent. Reviewed NTG use, walking for ex, continuing not to use tobacco, and CRP 2. Gave CHF booklet and discussed daily weights, when to call MD with signs/symptoms, 2000 mg sodium restriction and FR. Pt voiced understanding. Referring to [place] CRP 2.  Pt voiced understanding of ed.    Luetta Nutting, RN BSN  06/01/2020 9:05 AM

## 2020-06-01 NOTE — Progress Notes (Addendum)
Heart Failure Patient Advocate Encounter   Received notification from [employer] that prior authorization for Jermaine Morris is required.   PA submitted on CoverMyMeds Key [id number] Status is approved through 06/01/21   Copay $150 per month  Was successful in obtaining a copay card for [facility]  This copay card will make the patient's copay $10 per 30-90 day supply.  The billing information is as follows and has been shared with the patient to bring to outpatient pharmacy.   RxBin: [id number] PCN: [id number] Member ID: [id number] Group ID: [id number]   Sharen Hones, PharmD, BCPS Heart Failure Stewardship Pharmacist Phone [phone]  Please check [email or web address] for unit-specific pharmacist phone numbers

## 2020-06-01 NOTE — Progress Notes (Signed)
Patient's blood pressure continues to drop each time he receives his cardiovascular medications, with MAP as low as 65.  Will hold Entresto at this time and keep him admitted inpatient until at least tomorrow so that we can continue to monitor.   As discussed patient case with Dr. Jacinto Halim, who is in agreement with the plan.

## 2020-06-02 NOTE — Progress Notes (Signed)
CARDIAC REHAB PHASE I   PRE:  Rate/Rhythm: 91 SR PVCs  BP:  Supine:   Sitting: 113/60  Standing:    SaO2:   MODE:  Ambulation: 470 ft   POST:  Rate/Rhythm: 101 ST bigeminy and trigeminy PVCs  BP:  Supine: 97/62  Sitting:   Standing: 75/45 retook as no dizziness, 101/50 next reading   SaO2: 96%RA 1019-1042 Pt stated he is feeling well. More ectopy during this visit than I noticed yesterday with walk. Pt walked 470 ft on RA with steady gait. Denied dizziness. Pt's BP 97/62 after walk when he laid down. Since it was a drop from 113/60 prior to walk, asked pt to stand up again.  Took BP and first reading 75/45 which I found suspicious as pt not dizzy. Retook BP and it was 101/50 standing. Pt does not feel palpitations with the ectopy.    Luetta Nutting, RN BSN  06/02/2020 10:38 AM

## 2020-06-03 ENCOUNTER — Encounter (HOSPITAL_COMMUNITY): Payer: Self-pay | Admitting: Cardiology

## 2020-06-04 ENCOUNTER — Telehealth (HOSPITAL_COMMUNITY): Payer: Self-pay

## 2020-06-04 NOTE — Telephone Encounter (Signed)
Called and spoke with pt in regards to CR, pt stated he was driving and on a call. But will call CR back.

## 2020-06-04 NOTE — Telephone Encounter (Signed)
Pt insurance is active and benefits verified through [facility]. Co-pay $25.00, DED $2,500.00/$1,581.57 met, out of pocket $5,000.00/$2,031.00 met, co-insurance 0%. No pre-authorization required. Passport, 06/04/20 @ 4:05PM, ZPH#[id number]  Will contact patient to see if he is interested in the Cardiac Rehab Program. If interested, patient will need to complete follow up appt. Once completed, patient will be contacted for scheduling upon review by the RN Navigator.

## 2020-06-05 ENCOUNTER — Telehealth (HOSPITAL_COMMUNITY): Payer: Self-pay

## 2020-06-05 ENCOUNTER — Other Ambulatory Visit: Payer: Self-pay

## 2020-06-05 ENCOUNTER — Ambulatory Visit (HOSPITAL_COMMUNITY)
Admit: 2020-06-05 | Discharge: 2020-06-05 | Disposition: A | Discharge status: Home / Self Care | Payer: 59 | Attending: Internal Medicine | Admitting: Internal Medicine

## 2020-06-05 ENCOUNTER — Encounter (HOSPITAL_COMMUNITY): Payer: Self-pay

## 2020-06-05 VITALS — BP 106/58 | HR 56 | Wt 166.2 lb

## 2020-06-05 DIAGNOSIS — J849 Interstitial pulmonary disease, unspecified: Secondary | ICD-10-CM | POA: Insufficient documentation

## 2020-06-05 DIAGNOSIS — I272 Pulmonary hypertension, unspecified: Secondary | ICD-10-CM | POA: Diagnosis not present

## 2020-06-05 DIAGNOSIS — Z7902 Long term (current) use of antithrombotics/antiplatelets: Secondary | ICD-10-CM | POA: Insufficient documentation

## 2020-06-05 DIAGNOSIS — Z87891 Personal history of nicotine dependence: Secondary | ICD-10-CM

## 2020-06-05 DIAGNOSIS — Z955 Presence of coronary angioplasty implant and graft: Secondary | ICD-10-CM | POA: Diagnosis not present

## 2020-06-05 DIAGNOSIS — F1290 Cannabis use, unspecified, uncomplicated: Secondary | ICD-10-CM | POA: Insufficient documentation

## 2020-06-05 DIAGNOSIS — Z8249 Family history of ischemic heart disease and other diseases of the circulatory system: Secondary | ICD-10-CM | POA: Diagnosis not present

## 2020-06-05 DIAGNOSIS — I491 Atrial premature depolarization: Secondary | ICD-10-CM | POA: Insufficient documentation

## 2020-06-05 DIAGNOSIS — Z79899 Other long term (current) drug therapy: Secondary | ICD-10-CM | POA: Insufficient documentation

## 2020-06-05 DIAGNOSIS — I2510 Atherosclerotic heart disease of native coronary artery without angina pectoris: Secondary | ICD-10-CM | POA: Insufficient documentation

## 2020-06-05 DIAGNOSIS — Z7982 Long term (current) use of aspirin: Secondary | ICD-10-CM | POA: Diagnosis not present

## 2020-06-05 DIAGNOSIS — I5022 Chronic systolic (congestive) heart failure: Secondary | ICD-10-CM | POA: Diagnosis present

## 2020-06-05 DIAGNOSIS — J449 Chronic obstructive pulmonary disease, unspecified: Secondary | ICD-10-CM | POA: Diagnosis not present

## 2020-06-05 DIAGNOSIS — I255 Ischemic cardiomyopathy: Secondary | ICD-10-CM | POA: Diagnosis not present

## 2020-06-05 DIAGNOSIS — E785 Hyperlipidemia, unspecified: Secondary | ICD-10-CM | POA: Diagnosis not present

## 2020-06-05 NOTE — Patient Instructions (Signed)
Stop Entresto for now  If you have any questions, issues, or concerns before your next appointment please call our office at [phone], opt. 2 and leave a message for the triage nurse.  Thank you for allowing [place] to provider your heart failure care after your recent hospitalization. Please follow-up with [facility] as scheduled on Wed 06/13/2020   Do the following things EVERYDAY: 1) Weigh yourself in the morning before breakfast. Write it down and keep it in a log. 2) Take your medicines as prescribed 3) Eat low salt foods--Limit salt (sodium) to 2000 mg per day.  4) Stay as active as you can everyday 5) Limit all fluids for the day to less than 2 liters

## 2020-06-05 NOTE — Progress Notes (Signed)
[facility] V[facility] Transitions of Care Clinic Heart Failure Pharmacist Encounter  HPI:  60 YO male with PMH of COPD, interstitial lung disease, GERD, tobacco use, HLD. Recently established care with Dr. Jacinto Halim as outpatient for dyspnea on exertion. Last ECHO was done on 05/05/20 and LVEF was 15-20%. Presented 05/29/20 for elective R/LHC and coronary angiography. Found to have 100% stenosis in LAD with bridging collaterals as well as elevated filling pressures (RA 11, PA 48, PCWP 32, CO 4.3, CI 2.1) and was admitted for acute decompensated heart failure requiring IV diuresis. Taken back to the cath lab on 05/31/20 for elective PCI. He was then discharged on 06/02/20.  Today, Jermaine Morris presents to the Heart Failure Impact Clinic for follow up. He denies having shortness of breath, DOE, orthopnea/PND, or edema. He has reported having lightheadedness/fogginess following taking his first dose of Entresto this AM. He has been weighing himself daily at home (159-161lbs) but does not have a BP cuff at home. He has been following a low sodium and fluid restricted diet at home. He said his mother is a retired Scientific laboratory technician and helped him form a meal planner yesterday. He has been taking all medications as prescribed (with the exception of not taking digoxin - this was not listed on his d/c instructions) or Entresto (first dose taken this AM). RN Navigator received a call from CMA at [facility] office this morning and was told they wanted him to not take digoxin or Entresto until visit with them.   HF Medications: Carvedilol 3.125 mg BID Entresto 24/26 mg BID - has had one dose this AM only Spironolactone 12.5 mg daily Digoxin 0.25 mg daily - not taking yet per pt / discharge paperwork / [place] cardiology  Has the patient been experiencing any side effects to the medications prescribed?  Yes - lightheadedness following Entresto dose  Does the patient have any problems obtaining medications due to  transportation or finances?   no  Understanding of regimen: good Understanding of indications: good Potential of compliance: good Patient understands to avoid NSAIDs. Patient understands to avoid decongestants.   Pertinent Lab Values: . Serum creatinine 1.05, BUN 23, Potassium 3.4, Sodium 131, BNP 315  Vital Signs: . Weight: 166 lbs (discharge weight: 158 lbs) - weight in clinic influenced by heavy clothing/boots . Blood pressure: 106/58 mmHg  . Heart rate: 56 bpm   Medication Assistance / Insurance Benefits Check: Does the patient have prescription insurance?  Yes Type of insurance plan: UHC - commercial plan  Outpatient Pharmacy:  Current outpatient [facility] Was the [facility] pharmacy used to supply discharge medications? no  Is the patient willing to transition their outpatient pharmacy to utilize a [facility] outpatient pharmacy with or without mail order?   No  Assessment: 1) Chronic systolic CHF (EF 99-35%), due to ICM. NYHA class II symptoms. - Continue carvedilol 3.125 mg BID - Consider starting Entresto 24/26 mg BID vs low dose losartan at next visit with primary cardiologist - Continue spironolactone 12.5 mg daily - Continue to hold digoxin per [facility] cardiology (see above)  Plan: 1) Medication changes: - Continue current regimen - Recommend restarting digoxin at next visit   2) Patient Assistance: - Copay card provided during hospitalization for Entresto   3) Follow up: - Next appointment with Dr. Verl Dicker office on 06/13/20  Sharen Hones, PharmD, BCPS Heart Failure Transitions of Care Clinic Pharmacist [phone]

## 2020-06-05 NOTE — Telephone Encounter (Signed)
Called to confirm Heart & Vascular Transitions of Care appointment today 3/29 @ 3pm. Patient reminded to bring all medications and pill box organizer with them. Confirmed patient has transportation. Gave directions, instructed to utilize valet parking.  Pt stated he was able to pick up all medication except his Sherryll Burger as he lost the coupon card from the hospital and the pharmacy stated it was going to be $150, he has since found it and pick up his Entresto yesterday. Has yet to take it, suggested to start taking today as prescribed.   Confirmed appointment prior to ending call.

## 2020-06-05 NOTE — Progress Notes (Addendum)
Heart and Vascular Center Transitions of Care Clinic  PCP: Primary Cardiologist:  HPI:  Jermaine Morris is a 60 y.o.  male  with a PMH significant for ?COPD, ?ILD, GERD, tobacco use disorder, HLD, recently diagnosed CAD s/p PCI to LAD  Patient followed by [facility] outpatient he was referred there for dyspnea on exertion and abnormal ekg.  He had a nuclear stress test which was high risk 04/2020.  This was followed by an ECHO which demonstrated severely reduced EF 15-20% and severely dilated LV, severe MR, moderate TR.  About one month later he was having worsening dyspnea on exertion was volume overloaded and scheduled for [facility].  Signficant findings include markedly elevated filling pressures.  LAD: Proximal segment at septal perforator 1 has a 70 to 80% stenosis.  Following this there is 100% occlusion with ipsilateral and bridging collaterals to the LAD, short segment occlusion.  Large D1 and D2.  Diffuse mild coronary calcification is evident.  Given he was markedly overloaded he was diuresed before he underwent PCI to LAD.  Afterwards had a hypotensive episode given IV fluids and discharged holding entresto and digoxin.     Since hospitalization he has been doing well.  Denies any further chest pain, sleeping better now that his PND has resolved.  Still gets fatigued but no longer getting dyspneic with exertion.  Weight has been stable between 159 and 161lbs was 160lbs this am.      ROS: All systems negative except as listed in HPI, PMH and Problem List.  SH:  Social History   Socioeconomic History  . Marital status: Legally Separated    Spouse name: Not on file  . Number of children: 2  . Years of education: 86  . Highest education level: High school graduate  Occupational History  . Occupation: Nurse, mental health: [employer]  Tobacco Use  . Smoking status: Former Smoker    Packs/day: 1.50    Years: 30.00    Pack years: 45.00    Types: Cigarettes     Quit date: 12/2019    Years since quitting: 0.4  . Smokeless tobacco: Former Neurosurgeon    Types: Snuff  . Tobacco comment: Oct 2021, recently quit Chew also. Nicotine pouches-enoucraged to quit.    Vaping Use  . Vaping Use: Never used  Substance and Sexual Activity  . Alcohol use: Yes    Alcohol/week: 4.0 standard drinks    Types: 4 Shots of liquor per week    Comment: occ. social on weekends.  . Drug use: Yes    Frequency: 7.0 times per week    Types: Marijuana  . Sexual activity: Not on file  Other Topics Concern  . Not on file  Social History Narrative  . Not on file   Social Determinants of Health   Financial Resource Strain: Low Risk   . Difficulty of Paying Living Expenses: Not hard at all  Food Insecurity: No Food Insecurity  . Worried About Programme researcher, broadcasting/film/video in the Last Year: Never true  . Ran Out of Food in the Last Year: Never true  Transportation Needs: No Transportation Needs  . Lack of Transportation (Medical): No  . Lack of Transportation (Non-Medical): No  Physical Activity: Sufficiently Active  . Days of Exercise per Week: 7 days  . Minutes of Exercise per Session: 50 min  Stress: Not on file  Social Connections: Not on file  Intimate Partner Violence: Not on file    FH:  Family History  Problem Relation Age of Onset  . Cancer Father   . Heart attack Brother     Past Medical History:  Diagnosis Date  . COPD (chronic obstructive pulmonary disease) (HCC)   . Coronary artery disease   . Hyperlipidemia     Current Outpatient Medications  Medication Sig Dispense Refill  . albuterol (VENTOLIN HFA) 108 (90 Base) MCG/ACT inhaler Inhale 1-2 puffs into the lungs every 6 (six) hours as needed for shortness of breath or wheezing.    Marland Kitchen aspirin (ASPIRIN CHILDRENS) 81 MG chewable tablet Chew 1 tablet (81 mg total) by mouth daily.    . carvedilol (COREG) 3.125 MG tablet Take 1 tablet (3.125 mg total) by mouth 2 (two) times daily with a meal. 30 tablet 3  .  clopidogrel (PLAVIX) 75 MG tablet Take 1 tablet (75 mg total) by mouth daily with breakfast. 30 tablet 3  . famotidine (PEPCID) 20 MG tablet Take 1 tablet (20 mg total) by mouth 2 (two) times daily. 30 tablet 3  . meclizine (ANTIVERT) 25 MG tablet Take 25 mg by mouth 3 (three) times daily as needed for dizziness. For nausea    . Multiple Vitamins-Minerals (ONE-A-DAY MENS 50+) TABS Take 1 tablet by mouth daily.    . rosuvastatin (CRESTOR) 20 MG tablet Take 20 mg by mouth daily.    . sacubitril-valsartan (ENTRESTO) 24-26 MG Take 1 tablet by mouth 2 (two) times daily.    Marland Kitchen spironolactone (ALDACTONE) 25 MG tablet Take 0.5 tablets (12.5 mg total) by mouth daily. 15 tablet 3   No current facility-administered medications for this encounter.    Vitals:   06/05/20 1513  BP: (!) 106/58  Pulse: (!) 56  SpO2: 98%  Weight: 75.4 kg (166 lb 4 oz)    PHYSICAL EXAM: Cardiac: JVD flat, normal rate and rhythm, distant heart sounds, no LE edema Pulmonary: distant but CTAB, not in distress Abdominal: non distended abdomen, soft and nontender Psych: Alert, conversant, in good spirits  ECG    NSR with one PVC and one PAC rate of 76, normal axis, stable inferior lead twave inversions  ASSESSMENT & PLAN: Chronic systolic CHF, Ischemic Cardiomyopathy: -relatively new diagnosis 05/2020 LHC with severe LAD disease now s/p PCI to LAD  -04/2020 ECHO which demonstrated severely reduced EF 15-20% and severely dilated LV, severe MR, moderate TR. No mention of RV but almost certainly also has RV dysfunction based on cath and amount of TR -NYHA Class II symptoms, euvolemic on exam -discharged on carvedilol 3.125mg  BID, spiro 12.5mg , asa and plavix -Apparently there was some confusion about his discharge medications between patient and staff. [place] asked that he stop taking his entresto and digoxin due to hypotension.  He held his digoxin but did take his entresto this morning however and he is orthostatic here and  felt worse today, complained of dizziness.  Agree with continuing holding entresto, unlikely the digoxin would worsen his hypotension but we will hold this as well. -encouraged to purchase bp cuff so he can have one at home to assist as his medication titration continues -recommended he stopped drinking alcohol -If no improvement in heart function on repeat echo a few months from now would recommend referral to AHF   Pulmonary Hypertension: -RHC 05/29/20 with  PA 63/27 with mean pressure of -likely combination of primarily group 2 and maybe group 3 -may benefit from repeat evaluation after optimized from a cardiac standpoint -would recommend PFT's since he is euvolemic try and sort  out if he does have lung disease +/- HRCT if concerning for ILD -sleep study to rule out OSA   Tobacco Use:  -remains tobacco free, still using marijuana  Alcohol Use:  -drinks jack [employer]iel[employer] would have four shots once or twice a week.  He hasn't had any since discharge, recommended he continue to abstain completely    Follow up with [facility] cardiology already has appt April 6th

## 2020-06-09 ENCOUNTER — Other Ambulatory Visit: Payer: Self-pay | Admitting: Student

## 2020-06-13 ENCOUNTER — Encounter: Payer: Self-pay | Admitting: Student

## 2020-06-13 ENCOUNTER — Ambulatory Visit: Payer: 59 | Admitting: Student

## 2020-06-13 ENCOUNTER — Other Ambulatory Visit: Payer: Self-pay

## 2020-06-13 VITALS — BP 117/72 | HR 51 | Temp 98.7°F | Ht 72.0 in | Wt 169.0 lb

## 2020-06-13 DIAGNOSIS — R0609 Other forms of dyspnea: Secondary | ICD-10-CM

## 2020-06-13 DIAGNOSIS — R0600 Dyspnea, unspecified: Secondary | ICD-10-CM

## 2020-06-13 DIAGNOSIS — E7800 Pure hypercholesterolemia, unspecified: Secondary | ICD-10-CM

## 2020-06-13 DIAGNOSIS — I700 Atherosclerosis of aorta: Secondary | ICD-10-CM

## 2020-06-13 DIAGNOSIS — I5022 Chronic systolic (congestive) heart failure: Secondary | ICD-10-CM

## 2020-06-13 DIAGNOSIS — I255 Ischemic cardiomyopathy: Secondary | ICD-10-CM

## 2020-06-13 MED ORDER — NITROGLYCERIN 0.4 MG SL SUBL: 0.4000 mg | SUBLINGUAL_TABLET | SUBLINGUAL | 3 refills | Status: DC | PRN

## 2020-06-13 NOTE — Progress Notes (Signed)
Primary Physician/Referring:  Pieter Partridge, PA  Patient ID: Jermaine Morris, male    DOB: [birth year], 60 y.o.   MRN: [id number]  Chief Complaint  Patient presents with  . Coronary Artery Disease  . Hospitalization Follow-up   HPI:    Jermaine Morris  is a 60 y.o. Caucasian male patient with COPD, interstitial lung disease by CXR 02/16/2020, GERD, referred to me for evaluation of dyspnea and abnormal EKG. past medical history significant for tobacco use disorder quit 3 months ago in September 2021, hyperlipidemia.   Patient was originally referred to our office to establish care with concerns of worsening dyspnea on exertion.  Patient underwent echocardiogram which revealed cardiomyopathy with LVEF of 15-20%, nuclear stress test which was high risk, and coronary calcium score of 854.  Patient was therefore recommended for right and left heart catheterization.  Patient underwent right and left heart cath on 05/29/2020.  This procedure revealed patient was in acute decompensated heart failure with severely elevated LVEDP and pulmonary capillary wedge pressure, therefore despite LAD occlusion intervention was not done and patient was admitted to the hospital for diuresis and stabilization prior to PCI.  Patient was diuresed and started on guideline directed medical therapy which he tolerated well and was then taken for repeat cardiac catheterization on 05/31/2020.  During repeat cath LVEDP was 0 and patient underwent successful stenting to the proximal LAD.  Patient was discharged the following day, however due to hypotension Entresto and furosemide were discontinued as well as digoxin prior to discharge.  Patient now presents for follow-up.  Patient states he is feeling well overall, with significant improvement of dyspnea on exertion.  In fact patient is presently walking approximately 30 minutes/day without issue and is also doing some abdominal exercises which she is tolerating well.  Patient states he  has some lightheadedness approximately 2 hours after taking his medications.  He also reports occasional chest discomfort which he describes as dull soreness. This chest discomfort is not associated with exertion or other associated symptoms. He is concerned regarded right arm pain primarily at night when he lays on it. Denies palpitations, syncope, near syncope, leg swelling, orthopnea, PND. Notably patient has been taking spironolactone 25 mg instead of 122.5 mg as prescribed at discharge.   Past Medical History:  Diagnosis Date  . COPD (chronic obstructive pulmonary disease) ([place])   . Coronary artery disease   . Hyperlipidemia    Past Surgical History:  Procedure Laterality Date  . CHOLECYSTECTOMY    . CORONARY STENT INTERVENTION N/A 05/31/2020   Procedure: CORONARY STENT INTERVENTION;  Surgeon: Adrian Prows, MD;  Location: [facility];  Service: Cardiovascular;  Laterality: N/A;  . LEFT HEART CATH N/A 05/31/2020   Procedure: Left Heart Cath;  Surgeon: Adrian Prows, MD;  Location: [facility];  Service: Cardiovascular;  Laterality: N/A;  . RIGHT/LEFT HEART CATH AND CORONARY ANGIOGRAPHY N/A 05/29/2020   Procedure: RIGHT/LEFT HEART CATH AND CORONARY ANGIOGRAPHY;  Surgeon: Adrian Prows, MD;  Location: [facility];  Service: Cardiovascular;  Laterality: N/A;   Family History  Problem Relation Age of Onset  . Cancer Father   . Heart attack Brother     Social History   Tobacco Use  . Smoking status: Former Smoker    Packs/day: 1.50    Years: 30.00    Pack years: 45.00    Types: Cigarettes    Quit date: 12/2019    Years since quitting: 0.5  . Smokeless tobacco: Former Systems developer  Types: Snuff  . Tobacco comment: Oct 2021, recently quit Chew also. Nicotine pouches-enoucraged to quit.    Substance Use Topics  . Alcohol use: Yes    Alcohol/week: 4.0 standard drinks    Types: 4 Shots of liquor per week    Comment: occ. social on weekends.   Marital Status: Married  ROS   Review of Systems  Constitutional: Negative for malaise/fatigue and weight gain.  Cardiovascular: Positive for chest pain. Negative for claudication, dyspnea on exertion (resolved), leg swelling, near-syncope, orthopnea, palpitations, paroxysmal nocturnal dyspnea and syncope.  Respiratory: Negative for cough, shortness of breath, sputum production and wheezing.   Hematologic/Lymphatic: Does not bruise/bleed easily.  Musculoskeletal:       Right arm pain, worse when he lays on it at night   Gastrointestinal: Negative for melena.  Neurological: Positive for light-headedness. Negative for dizziness and weakness.   Objective  Blood pressure 117/72, pulse (!) 51, temperature 98.7 F (37.1 C), height 6' (1.829 m), weight 169 lb (76.7 kg), SpO2 97 %.  Vitals with BMI 06/13/2020 06/05/2020 06/02/2020  Height 6' 0"  - -  Weight 169 lbs 166 lbs 4 oz -  BMI 22.48 25.00 -  Systolic 370 488 891  Diastolic 72 58 78  Pulse 51 56 -     Physical Exam Vitals reviewed.  Constitutional:      Appearance: Normal appearance. He is normal weight.  HENT:     Head: Normocephalic and atraumatic.  Cardiovascular:     Rate and Rhythm: Normal rate and regular rhythm.     Pulses: Intact distal pulses.     Heart sounds: Normal heart sounds, S1 normal and S2 normal. No murmur heard. No gallop.      Comments: No leg edema, no JVD. Radial access healing well, no hematoma or bruit. Ecchymosis from wrist to elbow. Capillary refill <2 seconds. Radial and brachial pulse +2 Pulmonary:     Effort: Pulmonary effort is normal. No respiratory distress.     Breath sounds: Wheezing (scattered) and rales (bilateral bases fine crackles) present. No rhonchi.  Chest:    Abdominal:     General: Bowel sounds are normal.     Palpations: Abdomen is soft.  Musculoskeletal:     Right lower leg: No edema.     Left lower leg: No edema.  Skin:    General: Skin is warm and dry.  Neurological:     Mental Status: He is alert.     Laboratory examination:   Recent Labs    05/30/20 0337 05/31/20 0857 06/01/20 0311  NA 135 132* 131*  K 4.0 4.0 3.4*  CL 98 96* 97*  CO2 24 25 26   GLUCOSE 132* 151* 145*  BUN 18 26* 23*  CREATININE 1.10 1.10 1.05  CALCIUM 9.3 8.7* 8.4*  GFRNONAA >60 >60 >60   estimated creatinine clearance is 82.2 mL/min (by C-G formula based on SCr of 1.05 mg/dL).  CMP Latest Ref Rng & Units 06/01/2020 05/31/2020 05/30/2020  Glucose 70 - 99 mg/dL 145(H) 151(H) 132(H)  BUN 6 - 20 mg/dL 23(H) 26(H) 18  Creatinine 0.61 - 1.24 mg/dL 1.05 1.10 1.10  Sodium 135 - 145 mmol/L 131(L) 132(L) 135  Potassium 3.5 - 5.1 mmol/L 3.4(L) 4.0 4.0  Chloride 98 - 111 mmol/L 97(L) 96(L) 98  CO2 22 - 32 mmol/L 26 25 24   Calcium 8.9 - 10.3 mg/dL 8.4(L) 8.7(L) 9.3  Total Protein 6.0 - 8.3 g/dL - - -  Total Bilirubin 0.3 - 1.2 mg/dL - - -  Alkaline Phos 39 - 117 U/L - - -  AST 0 - 37 U/L - - -  ALT 0 - 53 U/L - - -   CBC Latest Ref Rng & Units 06/01/2020 05/30/2020 05/29/2020  WBC 4.0 - 10.5 K/uL 12.4(H) 10.9(H) 10.9(H)  Hemoglobin 13.0 - 17.0 g/dL 17.8(H) 16.8 16.6  Hematocrit 39.0 - 52.0 % 47.6 46.9 46.3  Platelets 150 - 400 K/uL 292 254 274    Lipid Panel No results for input(s): CHOL, TRIG, LDLCALC, VLDL, HDL, CHOLHDL, LDLDIRECT in the last 8760 hours.  HEMOGLOBIN A1C No results found for: HGBA1C, MPG TSH No results for input(s): TSH in the last 8760 hours.  External labs:   04/05/2020:  Total cholesterol 206, triglycerides 74, HDL 59, LDL 146.  Non-HDL cholesterol 147. Sodium 137, potassium 4.6, BUN 11, creatinine 1.01, EGFR >60 mL. TSH normal.  Medications and allergies   Allergies  Allergen Reactions  . Codeine Nausea And Vomiting     Outpatient Medications Prior to Visit  Medication Sig Dispense Refill  . albuterol (VENTOLIN HFA) 108 (90 Base) MCG/ACT inhaler Inhale 1-2 puffs into the lungs every 6 (six) hours as needed for shortness of breath or wheezing.    Marland Kitchen aspirin (ASPIRIN  CHILDRENS) 81 MG chewable tablet Chew 1 tablet (81 mg total) by mouth daily.    . carvedilol (COREG) 3.125 MG tablet TAKE 1 TABLET (3.125 MG TOTAL) BY MOUTH 2 (TWO) TIMES DAILY WITH A MEAL. 30 tablet 3  . clopidogrel (PLAVIX) 75 MG tablet Take 1 tablet (75 mg total) by mouth daily with breakfast. 30 tablet 3  . meclizine (ANTIVERT) 25 MG tablet Take 25 mg by mouth 3 (three) times daily as needed for dizziness. For nausea    . Multiple Vitamins-Minerals (ONE-A-DAY MENS 50+) TABS Take 1 tablet by mouth daily.    . rosuvastatin (CRESTOR) 20 MG tablet Take 20 mg by mouth daily.    Marland Kitchen spironolactone (ALDACTONE) 25 MG tablet Take 0.5 tablets (12.5 mg total) by mouth daily. 15 tablet 3  . famotidine (PEPCID) 20 MG tablet TAKE 1 TABLET BY MOUTH TWICE A DAY (Patient not taking: Reported on 06/13/2020) 30 tablet 3   No facility-administered medications prior to visit.    Radiology:  Chest x-ray 05/30/2018: The heart size and mediastinal contours are unchanged. No focal consolidation. No pulmonary edema. No pleural effusion. No pneumothorax. No acute osseous abnormality.  Chest x-ray PA and lateral view 02/16/2020: Cardiovascular: Cardiac silhouette and pulmonary vasculature are within normal limits.  Mediastinum: Within normal limits.  Lungs/pleura: No pneumothorax or pleural effusion. Coarse interstitial and reticular opacities noted throughout both lungs. No consolidation or focal airspace opacities.  CT of the abdomen 2015: Aortic atherosclerosis, no aneurysm.  Cardiac Studies:  Coronary angioplasty 05/31/2020: Successful stenting of the functionally occluded proximal LAD with implantation of a 3.5 x 18 mm resolute DES, due to edge dissection a 3.0 x 12 mm resolute Onyx DES was deployed. Distal stent postdilated with the same stent balloon overlapping 2 stents at 16 atmospheric pressure and proximal stent postdilated with a 3.5 mm x 10 mm Sapphire [place] at 16 atmospheric pressure. Stenosis reduced from  100% to 0%, TIMI I flow to TIMI III flow at the end of the procedure. 50 mL contrast utilized.  Recommendation:Upon presentation to the Cath Lab, as patient received sedation his blood pressure was very soft. Hence patient received IV bolus of 250 mL as his LVEDP was 0 mmHg. Closing pressure 80/60 mmHg. Patient completely asymptomatic  and in no distress. Will hold a.m. dose of Lasix and parameters on blood pressure medications.   Right and left heart catheterization 05/29/20 RA 14/15, mean 11 mmHg. RV 61/7, EDP 14 mmHg. PA 63/27, mean 48 mmHg. PA saturation 68%. PW 37/34, mean 32 mmHg. Aortic saturation 98%. Cardiac output 4.28, cardiac index 2.13 by Fick.  LV: 134/24, EDP 37 mmHg. There is no pressure gradient across the aortic valve. LM: Large vessel, has mild calcification. LAD: Proximal segment at septal perforator 1 has a 70 to 80% stenosis. Following this there is 100% occlusion with ipsilateral and bridging collaterals to the LAD, short segment occlusion. Large D1 and D2. Diffuse mild coronary calcification is evident. CX: Large caliber vessel. Gives origin to 2 large marginals. Mild diffuse disease. RCA: Large-caliber vessel. Mild disease is present.  Impression:Patient is in acute decompensated heart failure with severely elevated LVEDP and pulmonary capillary wedge pressure. Patient is also diaphoretic and has not been feeling well for the last few days. He will need to be admitted to the hospital for diuresis and stabilization. LAD is very short segment occlusion and appears to be amenable for revascularization, inview of of very young age, still worthwhile to proceed with angioplasty to see whether there will be remodeling of his LV with revascularization when stable hemodynamically. 30 mL contrast utilized.  CT cardiac scoring 05/09/2020: Left Main: 0 LAD: 623 LCx: 228 RCA: 3.4 Total Agatston Score: 854 MESA database percentile: 96 AORTA  MEASUREMENTS: Ascending Aorta: 28 mm Descending Aorta: 24 mm OTHER FINDINGS: Heart is normal size. Aorta normal caliber. No adenopathy. No confluent opacities or effusions. Imaging into the upper abdomen demonstrates no acute findings. Chest wall soft tissues are unremarkable. No acute bony abnormality.  IMPRESSION: Total Agatston score: [place] percentile: 96 No acute or significant extracardiac abnormality.  PCV ECHOCARDIOGRAM COMPLETE 05/04/2020 Left ventricle cavity is severely dilated. Normal left ventricular wall thickness. Severe global hypokinesis, LVEF 15-20%. Diastolic function not assessed due to severity of mitral regurgitation. Spontaneous echocontrast seen in LV cavity, without evidence of thrombus on this non-contrast study. Left atrial cavity is severely dilated. Severe posteriorly directed mitral regurgitation. Moderate tricuspid regurgitation. Estimated pulmonary artery systolic pressure 43 mmHg.   PCV MYOCARDIAL PERFUSION WITH LEXISCAN 05/02/2020 Lexiscan nuclear stress test performed using 1-day protocol. SPECT images show large LV cavity with global decrease in counts. In addition, there is medium sized, severe intensity, predominantly fixed perfusion defect in anterior apical myocardium. Global decrease in wall motion and thickening. Stress LVEF 13%. High risk study.  EKG:   06/13/2020: Sinus rhythm with 2 PVCs at a rate of 84 bpm.  Biatrial enlargement.  Normal axis.  Poor R wave progression, cannot exclude anteroseptal infarct old.   EKG 04/13/2020: Sinus tachycardia at rate of 103 bpm, biatrial enlargement, left bundle branch block.  PVCs (2).  No further analysis.  No significant change from 02/16/2020.   Assessment     ICD-10-CM   1. Chronic systolic heart failure (HCC)  I50.22   2. Ischemic cardiomyopathy  I25.5   3. Hypercholesteremia  E78.00   4. Dyspnea on exertion  R06.00   5. Aortic atherosclerosis (HCC)  I70.0 EKG 12-Lead      There are no discontinued medications.  Meds ordered this encounter  Medications  . nitroGLYCERIN (NITROSTAT) 0.4 MG SL tablet    Sig: Place 1 tablet (0.4 mg total) under the tongue every 5 (five) minutes as needed for chest pain.    Dispense:  30 tablet  Refill:  3   Orders Placed This Encounter  Procedures  . EKG 12-Lead    Recommendations:   Jermaine Morris is a 60 y.o. Caucasian male patient with COPD, interstitial lung disease by CXR 02/16/2020, GERD, referred to me for evaluation of dyspnea and abnormal EKG. past medical history significant for tobacco use disorder quit 3 months ago in September 2021, hyperlipidemia.  Patient was originally referred to our office to establish care with concerns of worsening dyspnea on exertion.  Patient underwent echocardiogram which revealed cardiomyopathy with LVEF of 15-20%, nuclear stress test which was high risk, and coronary calcium score of 854.  Patient was therefore recommended for right and left heart catheterization.  Patient underwent right and left heart cath on 05/29/2020.  This procedure revealed patient was in acute decompensated heart failure with severely elevated LVEDP and pulmonary capillary wedge pressure, therefore despite LAD occlusion intervention was not done and patient was admitted to the hospital for diuresis and stabilization prior to PCI.  Patient was diuresed and started on guideline directed medical therapy which he tolerated well and was then taken for repeat cardiac catheterization on 05/31/2020.  During repeat cath LVEDP was 0 and patient underwent successful stenting to the proximal LAD.  Patient was discharged the following day, however due to hypotension Entresto and furosemide were discontinued as well as digoxin prior to discharge.  Reccomend dual antiplatelet therapy until at least 05/31/2021 as tolerated.  Patient now presents for follow-up.  Overall patient is feeling well without dyspnea on exertion and no  clinical signs of heart failure.  He is tolerating guideline directed medical therapy including aspirin and Plavix, rosuvastatin, spironolactone, and carvedilol.  His blood pressure remains soft and patient does have episodes of lightheadedness following his medication administration.  We will therefore decrease spironolactone from 25 mg to 12.5 mg daily, as patient has been taking more than directed at discharge.  Hopefully reducing spironolactone will improve lightheadedness as well as make room in his blood pressure for addition of Entresto in the future.  In regard to patient's concern of "chest soreness", symptoms are atypical and exam is reassuring as he is tender on palpation over the left chest wall.  EKG today is relatively unchanged compared to EKG on 06/05/2020.  Counseled patient regarding signs and symptoms that would warrant urgent or emergent evaluation.S/L NTG was prescribed and explained how to and when to use it and to notify us if there is change in frequency of use. Interaction with cialis-like agents was discussed.  Patient verbalized understanding and agreement.  In regard to patient's right arm pain, he does have significant ecchymosis from the wrist to the elbow of his right arm, and it is tender to palpation on exam.  Access site is well-healed without hematoma or bruit.  Advised patient that bruising will continue to heal over the next few weeks and pain should improve.  Advised him that if pain fails to improve or gets worse or if he notices swelling, erythema, drainage to notify our office immediately.  Reinforced diet and medication compliance with patient.  Also encouraged him to proceed with cardiac rehab.  Follow-up closely in 2 weeks, sooner if needed, for heart failure and medication titration.   This was a 45-minute encounter with face-to-face counseling, medical records review, coordination of care, explanation of complex medical issues, complex medical decision making.      Alethia Berthold, PA-C 06/14/2020, 8:18 AM Office: [phone]

## 2020-07-02 ENCOUNTER — Ambulatory Visit: Payer: 59 | Admitting: Cardiology

## 2020-07-02 ENCOUNTER — Other Ambulatory Visit: Payer: Self-pay

## 2020-07-02 ENCOUNTER — Encounter: Payer: Self-pay | Admitting: Cardiology

## 2020-07-02 VITALS — BP 137/79 | HR 84 | Temp 98.4°F | Resp 16 | Ht 72.0 in | Wt 170.8 lb

## 2020-07-02 DIAGNOSIS — E7800 Pure hypercholesterolemia, unspecified: Secondary | ICD-10-CM

## 2020-07-02 DIAGNOSIS — I255 Ischemic cardiomyopathy: Secondary | ICD-10-CM

## 2020-07-02 DIAGNOSIS — I5022 Chronic systolic (congestive) heart failure: Secondary | ICD-10-CM

## 2020-07-02 MED ORDER — CARVEDILOL 6.25 MG PO TABS: 6.2500 mg | ORAL_TABLET | Freq: Two times a day (BID) | ORAL | 0 refills | Status: DC

## 2020-07-02 NOTE — Progress Notes (Signed)
Primary Physician/Referring:  Pieter Partridge, PA  Patient ID: Jermaine Morris, male    DOB: May 08, 1960, 60 y.o.   MRN: [id number]  Chief Complaint  Patient presents with  . Congestive Heart Failure  . Follow-up    2 weeks   HPI:    Jermaine Morris  is a 60 y.o. Caucasian male patient with COPD, interstitial lung disease by CXR 02/16/2020, GERD, referred to me for evaluation of dyspnea and abnormal EKG. past medical history significant for tobacco use disorder quit 3 months ago in September 2021, hyperlipidemia, , ischemic cardiomyopathy with severe LV systolic dysfunction of 15 to 20%, history of LAD stenting on 05/31/2020, last seen by [place] on 06/13/2020 presents here for 2-week follow-up.  He is presently doing well and states that since angioplasty he has felt the best he has in quite a while, did not realize that his heart was major issue previously with regard to dyspnea.  States that he has resumed all his activities and has not had any shortness of breath, PND or orthopnea or leg edema.  He has not had any chest discomfort either.  Past Medical History:  Diagnosis Date  . COPD (chronic obstructive pulmonary disease) ([place])   . Coronary artery disease   . Hyperlipidemia    Past Surgical History:  Procedure Laterality Date  . CHOLECYSTECTOMY    . CORONARY STENT INTERVENTION N/A 05/31/2020   Procedure: CORONARY STENT INTERVENTION;  Surgeon: Adrian Prows, MD;  Location: [facility];  Service: Cardiovascular;  Laterality: N/A;  . LEFT HEART CATH N/A 05/31/2020   Procedure: Left Heart Cath;  Surgeon: Adrian Prows, MD;  Location: [facility];  Service: Cardiovascular;  Laterality: N/A;  . RIGHT/LEFT HEART CATH AND CORONARY ANGIOGRAPHY N/A 05/29/2020   Procedure: RIGHT/LEFT HEART CATH AND CORONARY ANGIOGRAPHY;  Surgeon: Adrian Prows, MD;  Location: [facility];  Service: Cardiovascular;  Laterality: N/A;   Family History  Problem Relation Age of Onset  . Cancer Father   . Heart  attack Brother     Social History   Tobacco Use  . Smoking status: Former Smoker    Packs/day: 1.50    Years: 30.00    Pack years: 45.00    Types: Cigarettes    Quit date: 12/2019    Years since quitting: 0.5  . Smokeless tobacco: Former Systems developer    Types: Snuff  . Tobacco comment: Oct 2021, recently quit Chew also. Nicotine pouches-enoucraged to quit.    Substance Use Topics  . Alcohol use: Yes    Alcohol/week: 4.0 standard drinks    Types: 4 Shots of liquor per week    Comment: occ. social on weekends.   Marital Status: Married  ROS  Review of Systems  Cardiovascular: Negative for chest pain, dyspnea on exertion and leg swelling.  Gastrointestinal: Negative for melena.   Objective  Blood pressure 137/79, pulse 84, temperature 98.4 F (36.9 C), temperature source Temporal, resp. rate 16, height 6' (1.829 m), weight 170 lb 12.8 oz (77.5 kg), SpO2 98 %.  Vitals with BMI 07/02/2020 06/13/2020 06/05/2020  Height 6' 0"  6' 0"  -  Weight 170 lbs 13 oz 169 lbs 166 lbs 4 oz  BMI 23.16 82.50 53.97  Systolic 673 419 379  Diastolic 79 72 58  Pulse 84 51 56    Physical Exam Constitutional:      Appearance: Normal appearance. He is normal weight.  Cardiovascular:     Rate and Rhythm: Normal rate and regular  rhythm.     Pulses: Intact distal pulses.     Heart sounds: Normal heart sounds. No murmur heard. No gallop.      Comments: No leg edema, no JVD. Pulmonary:     Effort: Pulmonary effort is normal.     Breath sounds: Normal breath sounds. No wheezing or rales.     Comments: Barrel shaped chest Abdominal:     General: Bowel sounds are normal.     Palpations: Abdomen is soft.    Laboratory examination:   Recent Labs    05/30/20 0337 05/31/20 0857 06/01/20 0311  NA 135 132* 131*  K 4.0 4.0 3.4*  CL 98 96* 97*  CO2 24 25 26   GLUCOSE 132* 151* 145*  BUN 18 26* 23*  CREATININE 1.10 1.10 1.05  CALCIUM 9.3 8.7* 8.4*  GFRNONAA >60 >60 >60   CrCl cannot be calculated  (Patient's most recent lab result is older than the maximum 21 days allowed.).  CMP Latest Ref Rng & Units 06/01/2020 05/31/2020 05/30/2020  Glucose 70 - 99 mg/dL 145(H) 151(H) 132(H)  BUN 6 - 20 mg/dL 23(H) 26(H) 18  Creatinine 0.61 - 1.24 mg/dL 1.05 1.10 1.10  Sodium 135 - 145 mmol/L 131(L) 132(L) 135  Potassium 3.5 - 5.1 mmol/L 3.4(L) 4.0 4.0  Chloride 98 - 111 mmol/L 97(L) 96(L) 98  CO2 22 - 32 mmol/L 26 25 24   Calcium 8.9 - 10.3 mg/dL 8.4(L) 8.7(L) 9.3  Total Protein 6.0 - 8.3 g/dL - - -  Total Bilirubin 0.3 - 1.2 mg/dL - - -  Alkaline Phos 39 - 117 U/L - - -  AST 0 - 37 U/L - - -  ALT 0 - 53 U/L - - -   CBC Latest Ref Rng & Units 06/01/2020 05/30/2020 05/29/2020  WBC 4.0 - 10.5 K/uL 12.4(H) 10.9(H) 10.9(H)  Hemoglobin 13.0 - 17.0 g/dL 17.8(H) 16.8 16.6  Hematocrit 39.0 - 52.0 % 47.6 46.9 46.3  Platelets 150 - 400 K/uL 292 254 274    Lipid Panel No results for input(s): CHOL, TRIG, LDLCALC, VLDL, HDL, CHOLHDL, LDLDIRECT in the last 8760 hours.  HEMOGLOBIN A1C No results found for: HGBA1C, MPG TSH No results for input(s): TSH in the last 8760 hours.  External labs:   04/05/2020:  Total cholesterol 206, triglycerides 74, HDL 59, LDL 146.  Non-HDL cholesterol 147. Sodium 137, potassium 4.6, BUN 11, creatinine 1.01, EGFR >60 mL. TSH normal.  Medications and allergies   Allergies  Allergen Reactions  . Codeine Nausea And Vomiting    Outpatient Medications Prior to Visit  Medication Sig Dispense Refill  . aspirin (ASPIRIN CHILDRENS) 81 MG chewable tablet Chew 1 tablet (81 mg total) by mouth daily.    . clopidogrel (PLAVIX) 75 MG tablet Take 1 tablet (75 mg total) by mouth daily with breakfast. 30 tablet 3  . Multiple Vitamins-Minerals (ONE-A-DAY MENS 50+) TABS Take 1 tablet by mouth daily.    . nitroGLYCERIN (NITROSTAT) 0.4 MG SL tablet Place 1 tablet (0.4 mg total) under the tongue every 5 (five) minutes as needed for chest pain. 30 tablet 3  . rosuvastatin (CRESTOR) 20 MG  tablet Take 20 mg by mouth daily.    . sacubitril-valsartan (ENTRESTO) 24-26 MG Take 1 tablet by mouth 2 (two) times daily. 60 tablet   . spironolactone (ALDACTONE) 25 MG tablet Take 0.5 tablets (12.5 mg total) by mouth daily. 15 tablet 3  . carvedilol (COREG) 3.125 MG tablet TAKE 1 TABLET (3.125 MG TOTAL) BY MOUTH  2 (TWO) TIMES DAILY WITH A MEAL. 30 tablet 3  . albuterol (VENTOLIN HFA) 108 (90 Base) MCG/ACT inhaler Inhale 1-2 puffs into the lungs every 6 (six) hours as needed for shortness of breath or wheezing.    . famotidine (PEPCID) 20 MG tablet TAKE 1 TABLET BY MOUTH TWICE A DAY (Patient not taking: Reported on 06/13/2020) 30 tablet 3  . meclizine (ANTIVERT) 25 MG tablet Take 25 mg by mouth 3 (three) times daily as needed for dizziness. For nausea     No facility-administered medications prior to visit.   Radiology:  Chest x-ray 05/30/2018: The heart size and mediastinal contours are unchanged. No focal consolidation. No pulmonary edema. No pleural effusion. No pneumothorax. No acute osseous abnormality.  Chest x-ray PA and lateral view 02/16/2020: Cardiovascular: Cardiac silhouette and pulmonary vasculature are within normal limits.  Mediastinum: Within normal limits.  Lungs/pleura: No pneumothorax or pleural effusion. Coarse interstitial and reticular opacities noted throughout both lungs. No consolidation or focal airspace opacities.  CT of the abdomen 2015: Aortic atherosclerosis, no aneurysm.  Cardiac Studies:   PCV ECHOCARDIOGRAM COMPLETE 05/04/2020 Left ventricle cavity is severely dilated. Normal left ventricular wall thickness. Severe global hypokinesis, LVEF 15-20%. Diastolic function not assessed due to severity of mitral regurgitation. Spontaneous echocontrast seen in LV cavity, without evidence of thrombus on this non-contrast study. Left atrial cavity is severely dilated. Severe posteriorly directed mitral regurgitation. Moderate tricuspid regurgitation. Estimated  pulmonary artery systolic pressure 43 mmHg.   PCV MYOCARDIAL PERFUSION WITH LEXISCAN 05/02/2020 Lexiscan nuclear stress test performed using 1-day protocol. SPECT images show large LV cavity with global decrease in counts. In addition, there is medium sized, severe intensity, predominantly fixed perfusion defect in anterior apical myocardium. Global decrease in wall motion and thickening. Stress LVEF 13%. High risk study.  CT cardiac scoring 05/09/2020: Left Main: 0 LAD: 623 LCx: 228 RCA: 3.4 Total Agatston Score: 854 MESA database percentile: 96 AORTA MEASUREMENTS: Ascending Aorta: 28 mm Descending Aorta: 24 mm OTHER FINDINGS: Heart is normal size. Aorta normal caliber. No adenopathy. No confluent opacities or effusions. Imaging into the upper abdomen demonstrates no acute findings. Chest wall soft tissues are unremarkable. No acute bony abnormality.  IMPRESSION: Total Agatston score: [place] percentile: 96 No acute or significant extracardiac abnormality.  Right and left heart catheterization 05/29/20 RA 14/15, mean 11 mmHg. RV 61/7, EDP 14 mmHg. PA 63/27, mean 48 mmHg. PA saturation 68%. PW 37/34, mean 32 mmHg. Aortic saturation 98%. Cardiac output 4.28, cardiac index 2.13 by Fick.  LV: 134/24, EDP 37 mmHg. There is no pressure gradient across the aortic valve. LM: Large vessel, has mild calcification. LAD: Proximal segment at septal perforator 1 has a 70 to 80% stenosis. Following this there is 100% occlusion with ipsilateral and bridging collaterals to the LAD, short segment occlusion. Large D1 and D2. Diffuse mild coronary calcification is evident. CX: Large caliber vessel. Gives origin to 2 large marginals. Mild diffuse disease. RCA: Large-caliber vessel. Mild disease is present.  Impression:Patient is in acute decompensated heart failure with severely elevated LVEDP and pulmonary capillary wedge pressure. Patient is also diaphoretic  and has not been feeling well for the last few days. He will need to be admitted to the hospital for diuresis and stabilization. LAD is very short segment occlusion and appears to be amenable for revascularization, inview of of very young age, still worthwhile to proceed with angioplasty to see whether there will be remodeling of his LV with revascularization when stable hemodynamically. [place]  mL contrast utilized.  Coronary angioplasty 05/31/2020: Successful stenting of the functionally occluded proximal LAD with implantation of a 3.5 x 18 mm resolute DES, due to edge dissection a 3.0 x 12 mm resolute Onyx DES was deployed. Distal stent postdilated with the same stent balloon overlapping 2 stents at 16 atmospheric pressure and proximal stent postdilated with a 3.5 mm x 10 mm [place] at 16 atmospheric pressure. Stenosis reduced from 100% to 0%, TIMI I flow to TIMI III flow at the end of the procedure. 50 mL contrast utilized.  Recommendation:Upon presentation to the Cath Lab, as patient received sedation his blood pressure was very soft. Hence patient received IV bolus of 250 mL as his LVEDP was 0 mmHg. Closing pressure 80/60 mmHg. Patient completely asymptomatic and in no distress. Will hold a.m. dose of Lasix and parameters on blood pressure medications.   EKG:   06/13/2020: Sinus rhythm with 2 PVCs at a rate of 84 bpm.  Biatrial enlargement.  Normal axis.  Poor R wave progression, cannot exclude anteroseptal infarct old.   EKG 04/13/2020: Sinus tachycardia at rate of 103 bpm, biatrial enlargement, left bundle branch block.  PVCs (2).  No further analysis.  No significant change from 02/16/2020.   Assessment     ICD-10-CM   1. Chronic systolic heart failure (HCC)  I50.22 carvedilol (COREG) 6.25 MG tablet    sacubitril-valsartan (ENTRESTO) 24-26 MG    CBC  2. Ischemic cardiomyopathy  I25.5 carvedilol (COREG) 6.25 MG tablet    Basic metabolic panel  3. Hypercholesteremia  E78.00 Lipid  Panel With LDL/HDL Ratio     Medications Discontinued During This Encounter  Medication Reason  . albuterol (VENTOLIN HFA) 108 (90 Base) MCG/ACT inhaler Error  . famotidine (PEPCID) 20 MG tablet Error  . meclizine (ANTIVERT) 25 MG tablet Error  . carvedilol (COREG) 3.125 MG tablet Dose change    Meds ordered this encounter  Medications  . carvedilol (COREG) 6.25 MG tablet    Sig: Take 1 tablet (6.25 mg total) by mouth 2 (two) times daily.    Dispense:  180 tablet    Refill:  0   Orders Placed This Encounter  Procedures  . Lipid Panel With LDL/HDL Ratio  . Basic metabolic panel  . CBC    Recommendations:   Jermaine Morris is a 60 y.o. Caucasian male patient with COPD, interstitial lung disease by CXR 02/16/2020, GERD, referred to me for evaluation of dyspnea and abnormal EKG. past medical history significant for tobacco use disorder quit 3 months ago in September 2021, hyperlipidemia, ischemic cardiomyopathy with severe LV systolic dysfunction of 15 to 20%, history of LAD stenting on 05/31/2020, last seen by [place] on 06/13/2020 presents here for 2-week follow-up.  Patient presents here for 2-week follow-up and is feeling the best he has in quite a while, he has had complete resolution of dyspnea, he has remained abstinent from tobacco, no PND or orthopnea, no clinical evidence of heart failure.  His blood pressure is also improved now and he has resumed his activities gradually.  Will restart Entresto 24/26 mg twice daily, will also increase carvedilol from 3.25 mg to 6.25 mg p.o. twice daily.  Will obtain CBC, BMP and also lipid profile testing in 2 to 3 weeks and I would like to see him back in 4 weeks, if he tolerates Entresto low-dose, will increase to maximum tolerated dose.  Could also consider changing from Coreg to metoprolol succinate in view of underlying COPD.  Will obtain repeat  echocardiogram probably in 2 to 3 months once he is on guideline directed medical therapy to reevaluate  his LV systolic function.   Adrian Prows, PA-C 07/02/2020, 10:07 AM Office: [phone]

## 2020-07-15 ENCOUNTER — Other Ambulatory Visit: Payer: Self-pay | Admitting: Student

## 2020-07-18 ENCOUNTER — Telehealth (HOSPITAL_COMMUNITY): Payer: Self-pay

## 2020-07-18 NOTE — Telephone Encounter (Signed)
Called pt to see if he was interested in the cardiac rehab program, pt stated that he is not interested, advised pt to call back if anything changes. Closed referral.

## 2020-08-02 ENCOUNTER — Encounter: Payer: Self-pay | Admitting: Cardiology

## 2020-08-02 ENCOUNTER — Ambulatory Visit: Payer: 59 | Admitting: Cardiology

## 2020-08-02 ENCOUNTER — Other Ambulatory Visit: Payer: Self-pay

## 2020-08-02 VITALS — BP 100/68 | HR 68 | Temp 98.7°F | Resp 16 | Ht 72.0 in | Wt 168.4 lb

## 2020-08-02 DIAGNOSIS — I255 Ischemic cardiomyopathy: Secondary | ICD-10-CM

## 2020-08-02 DIAGNOSIS — I493 Ventricular premature depolarization: Secondary | ICD-10-CM

## 2020-08-02 DIAGNOSIS — I447 Left bundle-branch block, unspecified: Secondary | ICD-10-CM

## 2020-08-02 DIAGNOSIS — I5022 Chronic systolic (congestive) heart failure: Secondary | ICD-10-CM

## 2020-08-02 DIAGNOSIS — E7800 Pure hypercholesterolemia, unspecified: Secondary | ICD-10-CM

## 2020-08-02 DIAGNOSIS — R42 Dizziness and giddiness: Secondary | ICD-10-CM

## 2020-08-02 MED ORDER — CARVEDILOL 6.25 MG PO TABS: 6.2500 mg | ORAL_TABLET | Freq: Two times a day (BID) | ORAL | 1 refills | Status: DC

## 2020-08-02 NOTE — Progress Notes (Signed)
 Primary Physician/Referring:  Elmore, Kevin A, PA  Patient ID: Jermaine Morris, male    DOB: 06/08/1960, 60 y.o.   MRN: [id number]  Chief Complaint  Patient presents with  . Congestive Heart Failure  . Coronary Artery Disease   HPI:    Jermaine Morris  is a 60 y.o. Caucasian male patient with COPD, interstitial lung disease by CXR 02/16/2020, GERD, referred to me for evaluation of dyspnea and abnormal EKG. past medical history significant for tobacco use disorder quit 3 months ago in September 2021, hyperlipidemia, , ischemic cardiomyopathy with severe LV systolic dysfunction of 15 to 20%, history of LAD stenting on 05/31/2020, last seen by us on 06/13/2020 presents here for 2-week follow-up.  He is presently doing well and states that since angioplasty he has felt the best he has in quite a while, except for occasional dizziness when he suddenly stands up and very mild shortness of breath, denies PND or orthopnea, chest pain or leg edema.     Past Medical History:  Diagnosis Date  . COPD (chronic obstructive pulmonary disease) (HCC)   . Coronary artery disease   . Hyperlipidemia    Past Surgical History:  Procedure Laterality Date  . CHOLECYSTECTOMY    . CORONARY STENT INTERVENTION N/A 05/31/2020   Procedure: CORONARY STENT INTERVENTION;  Surgeon: , , MD;  Location: [facility];  Service: Cardiovascular;  Laterality: N/A;  . LEFT HEART CATH N/A 05/31/2020   Procedure: Left Heart Cath;  Surgeon: , , MD;  Location: [facility];  Service: Cardiovascular;  Laterality: N/A;  . RIGHT/LEFT HEART CATH AND CORONARY ANGIOGRAPHY N/A 05/29/2020   Procedure: RIGHT/LEFT HEART CATH AND CORONARY ANGIOGRAPHY;  Surgeon: , , MD;  Location: [facility];  Service: Cardiovascular;  Laterality: N/A;   Family History  Problem Relation Age of Onset  . Cancer Father   . Heart attack Brother     Social History   Tobacco Use  . Smoking status: Former Smoker     Packs/day: 1.50    Years: 30.00    Pack years: 45.00    Types: Cigarettes    Quit date: 12/2019    Years since quitting: 0.6  . Smokeless tobacco: Former User    Types: Snuff  . Tobacco comment: Oct 2021, recently quit Chew also. Nicotine pouches-enoucraged to quit.    Substance Use Topics  . Alcohol use: Not Currently    Alcohol/week: 4.0 standard drinks    Types: 4 Shots of liquor per week    Comment: occ. social on weekends.   Marital Status: Married  ROS  Review of Systems  Cardiovascular: Negative for chest pain, dyspnea on exertion and leg swelling.  Gastrointestinal: Negative for melena.   Objective  Blood pressure 100/68, pulse 68, temperature 98.7 F (37.1 C), temperature source Temporal, resp. rate 16, height 6' (1.829 m), weight 168 lb 6.4 oz (76.4 kg), SpO2 96 %.  Vitals with BMI 08/02/2020 07/02/2020 06/13/2020  Height 6' 0" 6' 0" 6' 0"  Weight 168 lbs 6 oz 170 lbs 13 oz 169 lbs  BMI 22.83 23.16 22.92  Systolic 100 137 117  Diastolic 68 79 72  Pulse 68 84 51    Physical Exam Constitutional:      Appearance: Normal appearance. He is normal weight.  Cardiovascular:     Rate and Rhythm: Normal rate and regular rhythm.     Pulses: Intact distal pulses.     Heart sounds: Normal heart sounds. No murmur   heard. No gallop.      Comments: No leg edema, no JVD. Pulmonary:     Effort: Pulmonary effort is normal.     Breath sounds: Normal breath sounds. No wheezing or rales.     Comments: Barrel shaped chest Abdominal:     General: Bowel sounds are normal.     Palpations: Abdomen is soft.    Laboratory examination:   Recent Labs    05/30/20 0337 05/31/20 0857 06/01/20 0311  NA 135 132* 131*  K 4.0 4.0 3.4*  CL 98 96* 97*  CO2 _0 GLUCOSE 132* 151* 145*  BUN 18 26* 23*  CREATININE 1.10 1.10 1.05  CALCIUM 9.3 8.7* 8.4*  GFRNONAA >60 >60 >60   CrCl cannot be calculated (Patient's most recent lab result is older than the maximum 21 days allowed.).   CMP Latest Ref Rng & Units 06/01/2020 05/31/2020 05/30/2020  Glucose 70 - 99 mg/dL 145(H) 151(H) 132(H)  BUN 6 - 20 mg/dL 23(H) 26(H) 18  Creatinine 0.61 - 1.24 mg/dL 1.05 1.10 1.10  Sodium 135 - 145 mmol/L 131(L) 132(L) 135  Potassium 3.5 - 5.1 mmol/L 3.4(L) 4.0 4.0  Chloride 98 - 111 mmol/L 97(L) 96(L) 98  CO2 22 - 32 mmol/L _1 Calcium 8.9 - 10.3 mg/dL 8.4(L) 8.7(L) 9.3  Total Protein 6.0 - 8.3 g/dL - - -  Total Bilirubin 0.3 - 1.2 mg/dL - - -  Alkaline Phos 39 - 117 U/L - - -  AST 0 - 37 U/L - - -  ALT 0 - 53 U/L - - -   CBC Latest Ref Rng & Units 06/01/2020 05/30/2020 05/29/2020  WBC 4.0 - 10.5 K/uL 12.4(H) 10.9(H) 10.9(H)  Hemoglobin 13.0 - 17.0 g/dL 17.8(H) 16.8 16.6  Hematocrit 39.0 - 52.0 % 47.6 46.9 46.3  Platelets 150 - 400 K/uL 292 254 274    Lipid Panel No results for input(s): CHOL, TRIG, LDLCALC, VLDL, HDL, CHOLHDL, LDLDIRECT in the last 8760 hours.  HEMOGLOBIN A1C No results found for: HGBA1C, MPG TSH No results for input(s): TSH in the last 8760 hours.  External labs:   04/05/2020:  Total cholesterol 206, triglycerides 74, HDL 59, LDL 146.  Non-HDL cholesterol 147. Sodium 137, potassium 4.6, BUN 11, creatinine 1.01, EGFR >60 mL. TSH normal.  Medications and allergies   Allergies  Allergen Reactions  . Codeine Nausea And Vomiting    Outpatient Medications Prior to Visit  Medication Sig Dispense Refill  . aspirin (ASPIRIN CHILDRENS) 81 MG chewable tablet Chew 1 tablet (81 mg total) by mouth daily.    . clopidogrel (PLAVIX) 75 MG tablet TAKE 1 TABLET BY MOUTH DAILY WITH BREAKFAST. 90 tablet 1  . Multiple Vitamins-Minerals (ONE-A-DAY MENS 50+) TABS Take 1 tablet by mouth daily.    . nitroGLYCERIN (NITROSTAT) 0.4 MG SL tablet Place 1 tablet (0.4 mg total) under the tongue every 5 (five) minutes as needed for chest pain. 30 tablet 3  . rosuvastatin (CRESTOR) 20 MG tablet Take 20 mg by mouth daily.    . sacubitril-valsartan (ENTRESTO) 24-26 MG Take 1  tablet by mouth 2 (two) times daily. 60 tablet   . spironolactone (ALDACTONE) 25 MG tablet Take 0.5 tablets (12.5 mg total) by mouth daily. 15 tablet 3  . carvedilol (COREG) 6.25 MG tablet Take 1 tablet (6.25 mg total) by mouth 2 (two) times daily. 180 tablet 0   No facility-administered medications prior to visit.   Radiology:  Chest x-ray 05/30/2018: The  heart size and mediastinal contours are unchanged. No focal consolidation. No pulmonary edema. No pleural effusion. No pneumothorax. No acute osseous abnormality.  Chest x-ray PA and lateral view 02/16/2020: Cardiovascular: Cardiac silhouette and pulmonary vasculature are within normal limits.  Mediastinum: Within normal limits.  Lungs/pleura: No pneumothorax or pleural effusion. Coarse interstitial and reticular opacities noted throughout both lungs. No consolidation or focal airspace opacities.  CT of the abdomen 2015: Aortic atherosclerosis, no aneurysm.  Cardiac Studies:   PCV ECHOCARDIOGRAM COMPLETE 05/04/2020 Left ventricle cavity is severely dilated. Normal left ventricular wall thickness. Severe global hypokinesis, LVEF 15-20%. Diastolic function not assessed due to severity of mitral regurgitation. Spontaneous echocontrast seen in LV cavity, without evidence of thrombus on this non-contrast study. Left atrial cavity is severely dilated. Severe posteriorly directed mitral regurgitation. Moderate tricuspid regurgitation. Estimated pulmonary artery systolic pressure 43 mmHg.   PCV MYOCARDIAL PERFUSION WITH LEXISCAN 05/02/2020 Lexiscan nuclear stress test performed using 1-day protocol. SPECT images show large LV cavity with global decrease in counts. In addition, there is medium sized, severe intensity, predominantly fixed perfusion defect in anterior apical myocardium. Global decrease in wall motion and thickening. Stress LVEF 13%. High risk study.  Right and left heart catheterization 05/29/20 RA 14/15, mean 11 mmHg. RV  61/7, EDP 14 mmHg. PA 63/27, mean 48 mmHg. PA saturation 68%. PW 37/34, mean 32 mmHg. Aortic saturation 98%. Cardiac output 4.28, cardiac index 2.13 by Fick.  LV: 134/24, EDP 37 mmHg. There is no pressure gradient across the aortic valve. LM: Large vessel, has mild calcification. LAD: Proximal segment at septal perforator 1 has a 70 to 80% stenosis. Following this there is 100% occlusion with ipsilateral and bridging collaterals to the LAD, short segment occlusion. Large D1 and D2. Diffuse mild coronary calcification is evident. CX: Large caliber vessel. Gives origin to 2 large marginals. Mild diffuse disease. RCA: Large-caliber vessel. Mild disease is present.  Impression:Patient is in acute decompensated heart failure with severely elevated LVEDP and pulmonary capillary wedge pressure. Patient is also diaphoretic and has not been feeling well for the last few days. He will need to be admitted to the hospital for diuresis and stabilization. LAD is very short segment occlusion and appears to be amenable for revascularization, inview of of very young age, still worthwhile to proceed with angioplasty to see whether there will be remodeling of his LV with revascularization when stable hemodynamically. 30 mL contrast utilized.  Coronary angioplasty 05/31/2020: Successful stenting of the functionally occluded proximal LAD with implantation of a 3.5 x 18 mm resolute DES, due to edge dissection a 3.0 x 12 mm resolute Onyx DES was deployed. Distal stent postdilated with the same stent balloon overlapping 2 stents at 16 atmospheric pressure and proximal stent postdilated with a 3.5 mm x 10 mm [place] at 16 atmospheric pressure. Stenosis reduced from 100% to 0%, TIMI I flow to TIMI III flow at the end of the procedure. 50 mL contrast utilized.  Recommendation:Upon presentation to the Cath Lab, as patient received sedation his blood pressure was very soft. Hence patient received IV  bolus of 250 mL as his LVEDP was 0 mmHg. Closing pressure 80/60 mmHg. Patient completely asymptomatic and in no distress. Will hold a.m. dose of Lasix and parameters on blood pressure medications.   EKG:   EKG 08/02/2020: Normal sinus rhythm at rate of 69 bpm, normal axis.  Poor R wave progression, cannot exclude anteroseptal infarct old.  Frequent PVCs (4), multifocal (2).  Nonspecific T abnormality.      06/13/2020: Sinus rhythm with 2 PVCs at a rate of 84 bpm.  Biatrial enlargement.  Normal axis.  Poor R wave progression, cannot exclude anteroseptal infarct old.   EKG 04/13/2020: Sinus tachycardia at rate of 103 bpm, biatrial enlargement, left bundle branch block.  PVCs (2).  No further analysis.  No significant change from 02/16/2020.   Assessment     ICD-10-CM   1. Chronic systolic heart failure (HCC)  I50.22 PCV ECHOCARDIOGRAM COMPLETE    carvedilol (COREG) 6.25 MG tablet  2. Ischemic cardiomyopathy  I25.5   3. Hypercholesteremia  E78.00 LDL cholesterol, direct  4. LBBB (left bundle branch block)  I44.7   5. Dizziness  R42 EKG 12-Lead  6. 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 carvedilol (COREG) 6.25 MG tablet Reorder    Meds ordered this encounter  Medications  . carvedilol (COREG) 6.25 MG tablet    Sig: Take 1 tablet (6.25 mg total) by mouth 2 (two) times daily.    Dispense:  180 tablet    Refill:  1   Orders Placed This Encounter  Procedures  . LDL cholesterol, direct  . EKG 12-Lead  . PCV ECHOCARDIOGRAM COMPLETE    Standing Status:   Future    Standing Expiration Date:   08/02/2021   Recommendations:   Jermaine Morris is a 59 y.o. Caucasian male patient with COPD, interstitial lung disease by CXR 02/16/2020, GERD, referred to me for evaluation of dyspnea and abnormal EKG. past medical history significant for tobacco use disorder quit 3 months ago in September 2021, hyperlipidemia,  ischemic cardiomyopathy with severe LV systolic dysfunction of 15 to 20%, history of LAD stenting on 05/31/2020.  He is presently doing well and except for occasional dizziness especially when he suddenly stands up he has no other symptoms.  His blood pressure is soft and he is presently on maximum tolerated medical therapy for heart failure and CAD with carvedilol 6.25 mg twice daily and minimum dose Entresto.  He needs lipid profile testing and other labs that was previously ordered.  His EKG does reveal frequent PVCs, will perform echocardiogram, if his LV function continues to remain low, he will need referral for EP for ICD implantation.  His lipids need to be checked as well.  I will see him back in 6 weeks for follow-up.  He has remained abstinent from tobacco use.  Lab orders including CMP were placed on previous visit, patient has not had this done.  Reinforced the importance of getting labs.  I would like to see him back in 4 to 6 weeks for follow-up and possibly uptitrate Entresto.   Adrian Prows, PA-C 08/02/2020, 10:29 PM Office: [phone]

## 2020-08-03 LAB — LDL CHOLESTEROL, DIRECT: LDL Direct: 66 mg/dL (ref 0–99)

## 2020-08-10 ENCOUNTER — Other Ambulatory Visit: Payer: Self-pay | Admitting: Student

## 2020-08-27 ENCOUNTER — Ambulatory Visit: Payer: 59

## 2020-08-27 ENCOUNTER — Other Ambulatory Visit: Payer: Self-pay

## 2020-08-27 DIAGNOSIS — I493 Ventricular premature depolarization: Secondary | ICD-10-CM

## 2020-08-27 DIAGNOSIS — I5022 Chronic systolic (congestive) heart failure: Secondary | ICD-10-CM

## 2020-09-06 NOTE — Progress Notes (Signed)
Echocardiogram 08/27/2020: Left ventricle cavity is mildly dilated. Normal left ventricular wall thickness. Severe global hypokinesis. LVEF 20-25%. Normal global wall motion. Doppler evidence of grade I (impaired) diastolic dysfunction, normal LAP. Moderate to severe mitral regurgitation. Suspect papillary muscle dysfunction. Moderate tricuspid regurgitation. Estimated pulmonary artery systolic pressure 26 mmHg. Previous study in 05/04/2020 estimated PASP at 43 mmHg.  Abnormal echocardiogram with persistent decrease in LV systolic function and severe MR.  Will discuss during office visit.

## 2020-09-12 ENCOUNTER — Other Ambulatory Visit: Payer: Self-pay | Admitting: Student

## 2020-09-13 ENCOUNTER — Ambulatory Visit: Payer: BC Managed Care – PPO | Admitting: Cardiology

## 2020-09-13 ENCOUNTER — Other Ambulatory Visit: Payer: Self-pay

## 2020-09-13 ENCOUNTER — Encounter: Payer: Self-pay | Admitting: Cardiology

## 2020-09-13 VITALS — BP 107/70 | HR 52 | Temp 97.9°F | Resp 17 | Ht 72.0 in | Wt 160.8 lb

## 2020-09-13 DIAGNOSIS — I255 Ischemic cardiomyopathy: Secondary | ICD-10-CM | POA: Diagnosis not present

## 2020-09-13 DIAGNOSIS — I5022 Chronic systolic (congestive) heart failure: Secondary | ICD-10-CM

## 2020-09-13 DIAGNOSIS — I340 Nonrheumatic mitral (valve) insufficiency: Secondary | ICD-10-CM

## 2020-09-13 DIAGNOSIS — I447 Left bundle-branch block, unspecified: Secondary | ICD-10-CM

## 2020-09-13 NOTE — Progress Notes (Signed)
Primary Physician/Referring:  Roderick Pee, PA  Patient ID: Jermaine Morris, male    DOB: Dec 22, 1960, 60 y.o.   MRN: [id number]  Chief Complaint  Patient presents with   Congestive Heart Failure    6 weeks   HPI:    Jermaine Morris  is a 60 y.o. Caucasian male patient with COPD, interstitial lung disease by CXR 02/16/2020, GERD, referred to me for evaluation of dyspnea and abnormal EKG. past medical history significant for tobacco use disorder quit 3 months ago in September 2021, hyperlipidemia, , ischemic cardiomyopathy with severe LV systolic dysfunction of 15 to 56%, history of LAD stenting on 05/31/2020, last seen by [place] on 06/13/2020 presents here for 2-week follow-up.  He is presently doing well and states that since angioplasty he has felt the best he has in quite a while, he has not had any further dizzy spells when he stands up.  He has mild chronic dyspnea and states that this has been stable and he denies any PND or orthopnea or leg edema.  He has not had any chest pain or palpitations.     Past Medical History:  Diagnosis Date   COPD (chronic obstructive pulmonary disease) (HCC)    Coronary artery disease    Hyperlipidemia    Past Surgical History:  Procedure Laterality Date   CHOLECYSTECTOMY     CORONARY STENT INTERVENTION N/A 05/31/2020   Procedure: CORONARY STENT INTERVENTION;  Surgeon: Yates Decamp, MD;  Location: [facility] INVASIVE CV [facility];  Service: Cardiovascular;  Laterality: N/A;   LEFT HEART CATH N/A 05/31/2020   Procedure: Left Heart Cath;  Surgeon: Yates Decamp, MD;  Location: [facility] INVASIVE CV LAB;  Service: Cardiovascular;  Laterality: N/A;   RIGHT/LEFT HEART CATH AND CORONARY ANGIOGRAPHY N/A 05/29/2020   Procedure: RIGHT/LEFT HEART CATH AND CORONARY ANGIOGRAPHY;  Surgeon: Yates Decamp, MD;  Location: [facility] INVASIVE CV LAB;  Service: Cardiovascular;  Laterality: N/A;   Family History  Problem Relation Age of Onset   Cancer Father    Heart attack Brother     Social History    Tobacco Use   Smoking status: Former    Packs/day: 1.50    Years: 30.00    Pack years: 45.00    Types: Cigarettes    Quit date: 12/2019    Years since quitting: 0.7   Smokeless tobacco: Former    Types: Snuff   Tobacco comments:    Oct 2021, recently quit Chew also. Nicotine pouches-enoucraged to quit.    Substance Use Topics   Alcohol use: Not Currently    Alcohol/week: 4.0 standard drinks    Types: 4 Shots of liquor per week    Comment: occ. social on weekends.   Marital Status: Married  ROS  Review of Systems  Cardiovascular:  Positive for dyspnea on exertion. Negative for chest pain and leg swelling.  Gastrointestinal:  Negative for melena.  Objective  Blood pressure 107/70, pulse (!) 52, temperature 97.9 F (36.6 C), temperature source Temporal, resp. rate 17, height 6' (1.829 m), weight 160 lb 12.8 oz (72.9 kg), SpO2 96 %.  Vitals with BMI 09/13/2020 09/13/2020 08/02/2020  Height - 6\' 0"  6\' 0"   Weight - 160 lbs 13 oz 168 lbs 6 oz  BMI - 21.8 22.83  Systolic 107 96 100  Diastolic 70 62 68  Pulse 52 57 68    Physical Exam Constitutional:      Appearance: Normal appearance. He is normal weight.  Cardiovascular:     Rate and Rhythm:  Normal rate and regular rhythm. Extrasystoles are present.    Pulses: Intact distal pulses.     Heart sounds: Normal heart sounds. No murmur heard.   No gallop.     Comments: No leg edema, no JVD. Pulmonary:     Effort: Pulmonary effort is normal.     Breath sounds: Normal breath sounds. No wheezing or rales.     Comments: Barrel shaped chest Abdominal:     General: Bowel sounds are normal.     Palpations: Abdomen is soft.   Laboratory examination:   Recent Labs    05/30/20 0337 05/31/20 0857 06/01/20 0311  NA 135 132* 131*  K 4.0 4.0 3.4*  CL 98 96* 97*  CO2 24 25 26   GLUCOSE 132* 151* 145*  BUN 18 26* 23*  CREATININE 1.10 1.10 1.05  CALCIUM 9.3 8.7* 8.4*  GFRNONAA >60 >60 >60   CrCl cannot be calculated (Patient's  most recent lab result is older than the maximum 21 days allowed.).  CMP Latest Ref Rng & Units 06/01/2020 05/31/2020 05/30/2020  Glucose 70 - 99 mg/dL 161(W145(H) 960(A151(H) 540(J132(H)  BUN 6 - 20 mg/dL 81(X23(H) 91(Y26(H) 18  Creatinine 0.61 - 1.24 mg/dL 7.821.05 9.561.10 2.131.10  Sodium 135 - 145 mmol/L 131(L) 132(L) 135  Potassium 3.5 - 5.1 mmol/L 3.4(L) 4.0 4.0  Chloride 98 - 111 mmol/L 97(L) 96(L) 98  CO2 22 - 32 mmol/L 26 25 24   Calcium 8.9 - 10.3 mg/dL 0.8(M8.4(L) 5.7(Q8.7(L) 9.3  Total Protein 6.0 - 8.3 g/dL - - -  Total Bilirubin 0.3 - 1.2 mg/dL - - -  Alkaline Phos 39 - 117 U/L - - -  AST 0 - 37 U/L - - -  ALT 0 - 53 U/L - - -   CBC Latest Ref Rng & Units 06/01/2020 05/30/2020 05/29/2020  WBC 4.0 - 10.5 K/uL 12.4(H) 10.9(H) 10.9(H)  Hemoglobin 13.0 - 17.0 g/dL 17.8(H) 16.8 16.6  Hematocrit 39.0 - 52.0 % 47.6 46.9 46.3  Platelets 150 - 400 K/uL 292 254 274    Lipid Panel Recent Labs    08/02/20 1531  LDLDIRECT 66  Labs 04/05/2020:  Total cholesterol 206, triglycerides 74, HDL 59, LDL 146.  Non-HDL cholesterol 147. BNP    Component Value Date/Time   BNP 315.0 (H) 06/01/2020 0311   ProBNP No results found for: PROBNP  External labs:   04/05/2020: TSH normal.  Medications and allergies   Allergies  Allergen Reactions   Codeine Nausea And Vomiting    Outpatient Medications Prior to Visit  Medication Sig Dispense Refill   aspirin (ASPIRIN CHILDRENS) 81 MG chewable tablet Chew 1 tablet (81 mg total) by mouth daily.     carvedilol (COREG) 6.25 MG tablet Take 1 tablet (6.25 mg total) by mouth 2 (two) times daily. 180 tablet 1   clopidogrel (PLAVIX) 75 MG tablet TAKE 1 TABLET BY MOUTH DAILY WITH BREAKFAST. 90 tablet 1   famotidine (PEPCID) 20 MG tablet Take 20 mg by mouth as needed for heartburn or indigestion.     Multiple Vitamins-Minerals (ONE-A-DAY MENS 50+) TABS Take 1 tablet by mouth daily.     rosuvastatin (CRESTOR) 20 MG tablet Take 20 mg by mouth daily.     sacubitril-valsartan (ENTRESTO) 24-26 MG  Take 1 tablet by mouth 2 (two) times daily. 60 tablet    spironolactone (ALDACTONE) 25 MG tablet TAKE 1/2 TABLET BY MOUTH EVERY DAY 45 tablet 1   nitroGLYCERIN (NITROSTAT) 0.4 MG SL tablet Place 1 tablet (0.4 mg  total) under the tongue every 5 (five) minutes as needed for chest pain. 30 tablet 3   No facility-administered medications prior to visit.   Radiology:  Chest x-ray 05/30/2018: The heart size and mediastinal contours are unchanged. No focal consolidation. No pulmonary edema. No pleural effusion. No pneumothorax. No acute osseous abnormality.  Chest x-ray PA and lateral view 02/16/2020: Cardiovascular: Cardiac silhouette and pulmonary vasculature are within normal limits.  Mediastinum: Within normal limits.  Lungs/pleura: No pneumothorax or pleural effusion. Coarse interstitial and reticular opacities noted throughout both lungs. No consolidation or focal airspace opacities.  CT of the abdomen 2015: Aortic atherosclerosis, no aneurysm.  Cardiac Studies:     PCV MYOCARDIAL PERFUSION WITH LEXISCAN 05/02/2020 Lexiscan nuclear stress test performed using 1-day protocol. SPECT images show large LV cavity with global decrease in counts. In addition, there is medium sized, severe intensity, predominantly fixed perfusion defect in anterior apical myocardium. Global decrease in wall motion and thickening. Stress LVEF 13%. High risk study.  Right and left heart catheterization 05/29/20  RA 14/15, mean 11 mmHg. RV 61/7, EDP 14 mmHg. PA 63/27, mean 48 mmHg.  PA saturation 68%. PW 37/34, mean 32 mmHg.  Aortic saturation 98%. Cardiac output 4.28, cardiac index 2.13 by Fick.   LV: 134/24, EDP 37 mmHg.  There is no pressure gradient across the aortic valve. LM: Large vessel, has mild calcification. LAD: Proximal segment at septal perforator 1 has a 70 to 80% stenosis.  Following this there is 100% occlusion with ipsilateral and bridging collaterals to the LAD, short segment occlusion.  Large  D1 and D2.  Diffuse mild coronary calcification is evident. CX: Large caliber vessel.  Gives origin to 2 large marginals.  Mild diffuse disease. RCA: Large-caliber vessel.  Mild disease is present.   Impression: Patient is in acute decompensated heart failure with severely elevated LVEDP and pulmonary capillary wedge pressure.  Patient is also diaphoretic and has not been feeling well for the last few days.  He will need to be admitted to the hospital for diuresis and stabilization.  LAD is very short segment occlusion and appears to be amenable for revascularization, inview of of very young age, still worthwhile to proceed with angioplasty to see whether there will be remodeling of his LV with revascularization when stable hemodynamically.  30 mL contrast utilized.  Coronary angioplasty 05/31/2020: Successful stenting of the functionally occluded proximal LAD with implantation of a 3.5 x 18 mm resolute DES, due to edge dissection a 3.0 x 12 mm resolute Onyx DES was deployed.  Distal stent postdilated with the same stent balloon overlapping 2 stents at 16 atmospheric pressure and proximal stent postdilated with a 3.5 mm x 10 mm Sapphire Covel at 16 atmospheric pressure. Stenosis reduced from 100% to 0%, TIMI I flow to TIMI III flow at the end of the procedure.  50 mL contrast utilized.   Recommendation: Upon presentation to the Cath Lab, as patient received sedation his blood pressure was very soft.  Hence patient received IV bolus of 250 mL as his LVEDP was 0 mmHg.  Closing pressure 80/60 mmHg.  Patient completely asymptomatic and in no distress.  Will hold a.m. dose of Lasix and parameters on blood pressure medications.   Echocardiogram 08/27/2020: Left ventricle cavity is mildly dilated. Normal left ventricular wall thickness. Severe global hypokinesis. LVEF 20-25%. Normal global wall motion. Doppler evidence of grade I (impaired) diastolic dysfunction, normal LAP. Moderate to severe mitral  regurgitation. Suspect papillary muscle dysfunction. Moderate tricuspid regurgitation. Estimated  pulmonary artery systolic pressure 26 mmHg. Previous study in 05/04/2020 estimated PASP at 43 mmHg. Otherwise no change.   EKG:   EKG 08/02/2020: Normal sinus rhythm at rate of 69 bpm, normal axis.  Poor R wave progression, cannot exclude anteroseptal infarct old.  Frequent PVCs (4), multifocal (2).  Nonspecific T abnormality.    06/13/2020: Sinus rhythm with 2 PVCs at a rate of 84 bpm.  Biatrial enlargement.  Normal axis.  Poor R wave progression, cannot exclude anteroseptal infarct old.   EKG 04/13/2020: Sinus tachycardia at rate of 103 bpm, biatrial enlargement, left bundle branch block.  PVCs (2).  No further analysis.  No significant change from 02/16/2020.   Assessment     ICD-10-CM   1. Chronic systolic heart failure (HCC)  X91.47 Ambulatory referral to Cardiac Electrophysiology    2. Ischemic cardiomyopathy  I25.5 Ambulatory referral to Cardiac Electrophysiology    3. Moderate to severe mitral regurgitation  I34.0     4. LBBB (left bundle branch block)  I44.7 Ambulatory referral to Cardiac Electrophysiology      There are no discontinued medications.   No orders of the defined types were placed in this encounter.  Orders Placed This Encounter  Procedures   Ambulatory referral to Cardiac Electrophysiology    Referral Priority:   Routine    Referral Type:   Consultation    Referral Reason:   Specialty Services Required    Requested Specialty:   Cardiology    Number of Visits Requested:   1   Recommendations:   Caedon Bond is a 60 y.o. Caucasian male patient with COPD, interstitial lung disease by CXR 02/16/2020, GERD, referred to me for evaluation of dyspnea and abnormal EKG. past medical history significant for tobacco use disorder quit 3 months ago in September 2021, hyperlipidemia, ischemic cardiomyopathy with severe LV systolic dysfunction of 15 to 82%, history of LAD  stenting on 05/31/2020.  He is presently doing well and has not had any further episodes of orthostatic dizziness.  He has been taking carvedilol twice daily 6.25 mg however occasionally forgets to take the evening dose which I reinforced that he can take it anytime of the day and not necessarily wait to be taken until after dinner.  He is only taking Entresto once a day 24/26 mg.  Advised him to increase it to twice daily as well.  His blood pressure is soft and he is presently on maximum tolerated medical therapy for heart failure and CAD with carvedilol 6.25 mg twice daily and minimum dose Entresto.  No change in the LVEF after 3 months of therapy.  LDL is at goal.  His EKG does reveal frequent PVCs, LV function continues to remain low, he will need referral for EP for ICD/BiV ICD implantation consideration, referral sent.  BNP was moderately elevated recently but could be his baseline.  Patient was initially reluctant to any of the procedures, but now amenable.  I have discussed with him regarding risk of sudden cardiac arrest in view of frequent PVCs, severe LV systolic dysfunction.  Although he has moderately severe MR probably related to papillary muscle dysfunction, no change in the MR by recent echocardiogram.  As there is no acute decompensated heart failure, we can continue to watch this closely, could certainly consider for mitral valve repair at some point.  I will see him back in 3 months for follow-up.  He has remained abstinent from tobacco use.  This was a 40-minute office visit encounter and discussing life-threatening  medical issues, decision making regarding medication and device use.    Yates Decamp, PA-C 09/13/2020, 6:25 PM Office: [phone]

## 2020-10-10 ENCOUNTER — Other Ambulatory Visit: Payer: Self-pay | Admitting: Student

## 2020-10-10 DIAGNOSIS — I5022 Chronic systolic (congestive) heart failure: Secondary | ICD-10-CM

## 2020-10-23 ENCOUNTER — Institutional Professional Consult (permissible substitution): Payer: Self-pay | Admitting: Internal Medicine

## 2020-11-19 ENCOUNTER — Other Ambulatory Visit: Payer: Self-pay

## 2020-11-19 DIAGNOSIS — I5022 Chronic systolic (congestive) heart failure: Secondary | ICD-10-CM

## 2020-11-19 MED ORDER — ENTRESTO 24-26 MG PO TABS: 1.0000 | ORAL_TABLET | Freq: Two times a day (BID) | ORAL | 1 refills | Status: DC

## 2020-12-14 ENCOUNTER — Other Ambulatory Visit: Payer: Self-pay

## 2020-12-14 ENCOUNTER — Encounter: Payer: Self-pay | Admitting: Cardiology

## 2020-12-14 ENCOUNTER — Ambulatory Visit: Payer: BC Managed Care – PPO | Admitting: Cardiology

## 2020-12-14 VITALS — BP 121/68 | HR 69 | Temp 97.8°F | Resp 16 | Ht 72.0 in | Wt 171.2 lb

## 2020-12-14 DIAGNOSIS — I255 Ischemic cardiomyopathy: Secondary | ICD-10-CM | POA: Diagnosis not present

## 2020-12-14 DIAGNOSIS — I340 Nonrheumatic mitral (valve) insufficiency: Secondary | ICD-10-CM | POA: Diagnosis not present

## 2020-12-14 DIAGNOSIS — I5022 Chronic systolic (congestive) heart failure: Secondary | ICD-10-CM

## 2020-12-14 NOTE — Progress Notes (Signed)
Primary Physician/Referring:  Roderick Pee, PA  Patient ID: Jermaine Morris, male    DOB: 1960/04/08, 60 y.o.   MRN: [id number]  No chief complaint on file.  HPI:    Jermaine Morris  is a 60 y.o. Caucasian male patient with COPD, interstitial lung disease by CXR 02/16/2020, GERD, referred to me for evaluation of dyspnea and abnormal EKG. past medical history significant for tobacco use disorder quit 3 months ago in September 2021, hyperlipidemia, ischemic cardiomyopathy with severe LV systolic dysfunction of 15 to 77%, history of LAD stenting on 05/31/2020.   Patient presents for 66-month follow-up.  Last office visit referred patient to EP for evaluation of ICD/BiV ICD implantation, unfortunately patient missed his appointment, however he is scheduled to see EP 12/25/2020.  Overall patient is feeling well.  He continues to exercise 4 to 5 days/week doing cardio 250-300 push-ups and about 100 sit-ups regularly without issue.  He does report mild left-sided chest pain which he can pinpoint to the fourth intercostal space in the midclavicular line.  This discomfort is not associated with exertion, primarily occurring at rest.  Patient denies dyspnea, syncope, near syncope, dizziness.  He does walk significantly while working, however he does not have a formal walking routine outside of work.  Notably he is weight has trended up since last office visit, however today he weighed in heavy work boots, which he did not last time.  Patient states he monitors his weight regularly at home and on home scales it has continued to remain stable at approximately 160 pounds.  Past Medical History:  Diagnosis Date   COPD (chronic obstructive pulmonary disease) (HCC)    Coronary artery disease    Hyperlipidemia    Past Surgical History:  Procedure Laterality Date   CHOLECYSTECTOMY     CORONARY STENT INTERVENTION N/A 05/31/2020   Procedure: CORONARY STENT INTERVENTION;  Surgeon: Yates Decamp, MD;  Location: [facility]  INVASIVE CV LAB;  Service: Cardiovascular;  Laterality: N/A;   LEFT HEART CATH N/A 05/31/2020   Procedure: Left Heart Cath;  Surgeon: Yates Decamp, MD;  Location: [facility] INVASIVE CV LAB;  Service: Cardiovascular;  Laterality: N/A;   RIGHT/LEFT HEART CATH AND CORONARY ANGIOGRAPHY N/A 05/29/2020   Procedure: RIGHT/LEFT HEART CATH AND CORONARY ANGIOGRAPHY;  Surgeon: Yates Decamp, MD;  Location: [facility] INVASIVE CV LAB;  Service: Cardiovascular;  Laterality: N/A;   Family History  Problem Relation Age of Onset   Cancer Father    Heart attack Brother     Social History   Tobacco Use   Smoking status: Former    Packs/day: 1.50    Years: 30.00    Pack years: 45.00    Types: Cigarettes    Quit date: 12/2019    Years since quitting: 1.0   Smokeless tobacco: Former    Types: Snuff   Tobacco comments:    Oct 2021, recently quit Chew also. Nicotine pouches-enoucraged to quit.    Substance Use Topics   Alcohol use: Not Currently    Alcohol/week: 4.0 standard drinks    Types: 4 Shots of liquor per week    Comment: occ. social on weekends.   Marital Status: Married  ROS  Review of Systems  Cardiovascular:  Positive for chest pain (left-sided, at rest). Negative for claudication, dyspnea on exertion (imrpoved), leg swelling, near-syncope, orthopnea, palpitations, paroxysmal nocturnal dyspnea and syncope.  Respiratory:  Negative for shortness of breath.   Gastrointestinal:  Negative for melena.  Neurological:  Negative for dizziness.  Objective  Blood pressure 121/68, pulse 69, temperature 97.8 F (36.6 C), temperature source Temporal, resp. rate 16, height 6' (1.829 m), weight 171 lb 3.2 oz (77.7 kg), SpO2 97 %.  Vitals with BMI 12/14/2020 09/13/2020 09/13/2020  Height 6\' 0"  - 6\' 0"   Weight 171 lbs 3 oz - 160 lbs 13 oz  BMI 23.21 - 21.8  Systolic 121 107 96  Diastolic 68 70 62  Pulse 69 52 57    Physical Exam Constitutional:      Appearance: Normal appearance. He is normal weight.  Cardiovascular:      Rate and Rhythm: Normal rate and regular rhythm. No extrasystoles are present.    Pulses: Intact distal pulses.     Heart sounds: Normal heart sounds. No murmur heard.   No gallop.     Comments: No JVD. Pulmonary:     Effort: Pulmonary effort is normal.     Breath sounds: Normal breath sounds. No wheezing or rales.     Comments: Barrel shaped chest Chest:    Abdominal:     General: Bowel sounds are normal.     Palpations: Abdomen is soft.  Musculoskeletal:     Right lower leg: No edema.     Left lower leg: No edema.   Laboratory examination:   Recent Labs    05/30/20 0337 05/31/20 0857 06/01/20 0311  NA 135 132* 131*  K 4.0 4.0 3.4*  CL 98 96* 97*  CO2 24 25 26   GLUCOSE 132* 151* 145*  BUN 18 26* 23*  CREATININE 1.10 1.10 1.05  CALCIUM 9.3 8.7* 8.4*  GFRNONAA >60 >60 >60   CrCl cannot be calculated (Patient's most recent lab result is older than the maximum 21 days allowed.).  CMP Latest Ref Rng & Units 06/01/2020 05/31/2020 05/30/2020  Glucose 70 - 99 mg/dL 06/03/2020) 06/02/2020) 06/01/2020)  BUN 6 - 20 mg/dL 270(B) 867(J) 18  Creatinine 0.61 - 1.24 mg/dL 449(E 01(E 07(H  Sodium 135 - 145 mmol/L 131(L) 132(L) 135  Potassium 3.5 - 5.1 mmol/L 3.4(L) 4.0 4.0  Chloride 98 - 111 mmol/L 97(L) 96(L) 98  CO2 22 - 32 mmol/L 26 25 24   Calcium 8.9 - 10.3 mg/dL 2.19) 7.58) 9.3  Total Protein 6.0 - 8.3 g/dL - - -  Total Bilirubin 0.3 - 1.2 mg/dL - - -  Alkaline Phos 39 - 117 U/L - - -  AST 0 - 37 U/L - - -  ALT 0 - 53 U/L - - -   CBC Latest Ref Rng & Units 06/01/2020 05/30/2020 05/29/2020  WBC 4.0 - 10.5 K/uL 12.4(H) 10.9(H) 10.9(H)  Hemoglobin 13.0 - 17.0 g/dL 17.8(H) 16.8 16.6  Hematocrit 39.0 - 52.0 % 47.6 46.9 46.3  Platelets 150 - 400 K/uL 292 254 274    Lipid Panel Recent Labs    08/02/20 1531  LDLDIRECT 66  Labs 04/05/2020:  Total cholesterol 206, triglycerides 74, HDL 59, LDL 146.  Non-HDL cholesterol 147. BNP    Component Value Date/Time   BNP 315.0 (H) 06/01/2020 0311    ProBNP No results found for: PROBNP  External labs:   04/05/2020: TSH normal. Allergies   Allergies  Allergen Reactions   Codeine Nausea And Vomiting      Medications Prior to Visit:   Outpatient Medications Prior to Visit  Medication Sig Dispense Refill   aspirin (ASPIRIN CHILDRENS) 81 MG chewable tablet Chew 1 tablet (81 mg total) by mouth daily.     carvedilol (COREG) 6.25 MG tablet Take 1 tablet (  6.25 mg total) by mouth 2 (two) times daily. 180 tablet 1   clopidogrel (PLAVIX) 75 MG tablet TAKE 1 TABLET BY MOUTH DAILY WITH BREAKFAST. 90 tablet 1   Multiple Vitamins-Minerals (ONE-A-DAY MENS 50+) TABS Take 1 tablet by mouth daily.     nitroGLYCERIN (NITROSTAT) 0.4 MG SL tablet Place 1 tablet (0.4 mg total) under the tongue every 5 (five) minutes as needed for chest pain. 30 tablet 3   rosuvastatin (CRESTOR) 20 MG tablet Take 20 mg by mouth daily.     sacubitril-valsartan (ENTRESTO) 24-26 MG Take 1 tablet by mouth 2 (two) times daily. 60 tablet 1   spironolactone (ALDACTONE) 25 MG tablet TAKE 1/2 TABLET BY MOUTH EVERY DAY 45 tablet 1   famotidine (PEPCID) 20 MG tablet Take 20 mg by mouth as needed for heartburn or indigestion.     No facility-administered medications prior to visit.     Final Medications at End of Visit    Current Meds  Medication Sig   aspirin (ASPIRIN CHILDRENS) 81 MG chewable tablet Chew 1 tablet (81 mg total) by mouth daily.   carvedilol (COREG) 6.25 MG tablet Take 1 tablet (6.25 mg total) by mouth 2 (two) times daily.   clopidogrel (PLAVIX) 75 MG tablet TAKE 1 TABLET BY MOUTH DAILY WITH BREAKFAST.   Multiple Vitamins-Minerals (ONE-A-DAY MENS 50+) TABS Take 1 tablet by mouth daily.   nitroGLYCERIN (NITROSTAT) 0.4 MG SL tablet Place 1 tablet (0.4 mg total) under the tongue every 5 (five) minutes as needed for chest pain.   rosuvastatin (CRESTOR) 20 MG tablet Take 20 mg by mouth daily.   sacubitril-valsartan (ENTRESTO) 24-26 MG Take 1 tablet by mouth 2  (two) times daily.   spironolactone (ALDACTONE) 25 MG tablet TAKE 1/2 TABLET BY MOUTH EVERY DAY   Radiology:  Chest x-ray 05/30/2018: The heart size and mediastinal contours are unchanged. No focal consolidation. No pulmonary edema. No pleural effusion. No pneumothorax. No acute osseous abnormality.  Chest x-ray PA and lateral view 02/16/2020: Cardiovascular: Cardiac silhouette and pulmonary vasculature are within normal limits.  Mediastinum: Within normal limits.  Lungs/pleura: No pneumothorax or pleural effusion. Coarse interstitial and reticular opacities noted throughout both lungs. No consolidation or focal airspace opacities.  CT of the abdomen 2015: Aortic atherosclerosis, no aneurysm.  Cardiac Studies:     PCV MYOCARDIAL PERFUSION WITH LEXISCAN 05/02/2020 Lexiscan nuclear stress test performed using 1-day protocol. SPECT images show large LV cavity with global decrease in counts. In addition, there is medium sized, severe intensity, predominantly fixed perfusion defect in anterior apical myocardium. Global decrease in wall motion and thickening. Stress LVEF 13%. High risk study.  Right and left heart catheterization 05/29/20  RA 14/15, mean 11 mmHg. RV 61/7, EDP 14 mmHg. PA 63/27, mean 48 mmHg.  PA saturation 68%. PW 37/34, mean 32 mmHg.  Aortic saturation 98%. Cardiac output 4.28, cardiac index 2.13 by Fick.   LV: 134/24, EDP 37 mmHg.  There is no pressure gradient across the aortic valve. LM: Large vessel, has mild calcification. LAD: Proximal segment at septal perforator 1 has a 70 to 80% stenosis.  Following this there is 100% occlusion with ipsilateral and bridging collaterals to the LAD, short segment occlusion.  Large D1 and D2.  Diffuse mild coronary calcification is evident. CX: Large caliber vessel.  Gives origin to 2 large marginals.  Mild diffuse disease. RCA: Large-caliber vessel.  Mild disease is present.   Impression: Patient is in acute decompensated heart  failure with severely elevated LVEDP  and pulmonary capillary wedge pressure.  Patient is also diaphoretic and has not been feeling well for the last few days.  He will need to be admitted to the hospital for diuresis and stabilization.  LAD is very short segment occlusion and appears to be amenable for revascularization, inview of of very young age, still worthwhile to proceed with angioplasty to see whether there will be remodeling of his LV with revascularization when stable hemodynamically.  30 mL contrast utilized.  Coronary angioplasty 05/31/2020: Successful stenting of the functionally occluded proximal LAD with implantation of a 3.5 x 18 mm resolute DES, due to edge dissection a 3.0 x 12 mm resolute Onyx DES was deployed.  Distal stent postdilated with the same stent balloon overlapping 2 stents at 16 atmospheric pressure and proximal stent postdilated with a 3.5 mm x 10 mm Sapphire [place] at 16 atmospheric pressure. Stenosis reduced from 100% to 0%, TIMI I flow to TIMI III flow at the end of the procedure.  50 mL contrast utilized.   Recommendation: Upon presentation to the Cath Lab, as patient received sedation his blood pressure was very soft.  Hence patient received IV bolus of 250 mL as his LVEDP was 0 mmHg.  Closing pressure 80/60 mmHg.  Patient completely asymptomatic and in no distress.  Will hold a.m. dose of Lasix and parameters on blood pressure medications.   Echocardiogram 08/27/2020: Left ventricle cavity is mildly dilated. Normal left ventricular wall thickness. Severe global hypokinesis. LVEF 20-25%. Normal global wall motion. Doppler evidence of grade I (impaired) diastolic dysfunction, normal LAP. Moderate to severe mitral regurgitation. Suspect papillary muscle dysfunction. Moderate tricuspid regurgitation. Estimated pulmonary artery systolic pressure 26 mmHg. Previous study in 05/04/2020 estimated PASP at 43 mmHg. Otherwise no change.   EKG:   EKG 08/02/2020: Normal sinus rhythm at  rate of 69 bpm, normal axis.  Poor R wave progression, cannot exclude anteroseptal infarct old.  Frequent PVCs (4), multifocal (2).  Nonspecific T abnormality.    06/13/2020: Sinus rhythm with 2 PVCs at a rate of 84 bpm.  Biatrial enlargement.  Normal axis.  Poor R wave progression, cannot exclude anteroseptal infarct old.   EKG 04/13/2020: Sinus tachycardia at rate of 103 bpm, biatrial enlargement, left bundle branch block.  PVCs (2).  No further analysis.  No significant change from 02/16/2020.   Assessment     ICD-10-CM   1. Chronic systolic heart failure (HCC)  X52.84     2. Ischemic cardiomyopathy  I25.5     3. Moderate to severe mitral regurgitation  I34.0        Medications Discontinued During This Encounter  Medication Reason   famotidine (PEPCID) 20 MG tablet Error     No orders of the defined types were placed in this encounter.  No orders of the defined types were placed in this encounter.  Recommendations:   Jermaine Morris is a 60 y.o. Caucasian male patient with COPD, interstitial lung disease by CXR 02/16/2020, GERD, referred to me for evaluation of dyspnea and abnormal EKG. past medical history significant for tobacco use disorder quit 3 months ago in September 2021, hyperlipidemia, ischemic cardiomyopathy with severe LV systolic dysfunction of 15 to 13%, history of LAD stenting on 05/31/2020.  Patient is now compliant with medications, taking his carvedilol and Entresto as directed.  He has had no recurrence of dizziness or orthostasis.  His chest discomfort is suggestive of musculoskeletal etiology as it is very localized and not associated with exertion.  Counseled patient regarding signs  and symptoms that would be more suggestive of cardiac etiology and advised him to notify our office immediately if he experiences any of these, patient verbalized understanding agreement.  Encourage patient to remain active and continue with heart healthy diet and medication compliance.   Also encourage patient to start walking on a daily basis.  Patient has consultation appointment with electrophysiology later this month for consideration of ICD/BiV ICD implantation. He is otherwise stable from a cardiovascular standpoint.   Although he has moderately severe MR probably related to papillary muscle dysfunction, no change in the MR by recent echocardiogram.  As there is no acute decompensated heart failure, we can continue to watch this closely, could certainly consider for mitral valve repair at some point.  Follow up in 3 months, sooner if needed, for CAD, HFrEF, MR.    Rayford Halsted, PA-C 12/14/2020, 12:55 PM Office: [phone]

## 2020-12-25 ENCOUNTER — Ambulatory Visit: Payer: BC Managed Care – PPO | Admitting: Internal Medicine

## 2020-12-25 ENCOUNTER — Other Ambulatory Visit: Payer: Self-pay

## 2020-12-25 VITALS — BP 128/70 | HR 78 | Ht 72.0 in | Wt 173.8 lb

## 2020-12-25 DIAGNOSIS — I255 Ischemic cardiomyopathy: Secondary | ICD-10-CM

## 2020-12-25 NOTE — Patient Instructions (Addendum)
Medication Instructions:  Your physician recommends that you continue on your current medications as directed. Please refer to the Current Medication list given to you today.  Labwork: None ordered.  Testing/Procedures: Your physician has recommended that you have a defibrillator inserted. An implantable cardioverter defibrillator (ICD) is a small device that is placed in your chest or, in rare cases, your abdomen. This device uses electrical pulses or shocks to help control life-threatening, irregular heartbeats that could lead the heart to suddenly stop beating (sudden cardiac arrest). Leads are attached to the ICD that goes into your heart. This is done in the hospital and usually requires an overnight stay. Please see the instruction sheet given to you today for more information.  Follow-Up:  The following dates are available for ICD implant:  October 24, 27, 31 November 7, 11, 14, 23, 25 and 28  Any Other Special Instructions Will Be Listed Below (If Applicable).  If you need a refill on your cardiac medications before your next appointment, please call your pharmacy.

## 2020-12-25 NOTE — Progress Notes (Signed)
HPI Mr. Jermaine Morris is referred for evaluation and to consider ICD insertion. He is a pleasant 60 yo man with an ICM, class 1 symptoms now on medical therapy, and a h/o tobacco abuse in remission. He has known COPD. His EF is 20% despite maximal medical therapy. He has not had syncope. Review of his ECG's demonstrates intermittent LBBB with a LBBB QRS of about 140 and an narrow QRS at 96. He denies peripheral edema.  Allergies  Allergen Reactions   Codeine Nausea And Vomiting     Current Outpatient Medications  Medication Sig Dispense Refill   aspirin (ASPIRIN CHILDRENS) 81 MG chewable tablet Chew 1 tablet (81 mg total) by mouth daily.     carvedilol (COREG) 6.25 MG tablet Take 1 tablet (6.25 mg total) by mouth 2 (two) times daily. 180 tablet 1   clopidogrel (PLAVIX) 75 MG tablet TAKE 1 TABLET BY MOUTH DAILY WITH BREAKFAST. 90 tablet 1   Multiple Vitamins-Minerals (ONE-A-DAY MENS 50+) TABS Take 1 tablet by mouth daily.     rosuvastatin (CRESTOR) 20 MG tablet Take 20 mg by mouth daily.     sacubitril-valsartan (ENTRESTO) 24-26 MG Take 1 tablet by mouth 2 (two) times daily. 60 tablet 1   spironolactone (ALDACTONE) 25 MG tablet TAKE 1/2 TABLET BY MOUTH EVERY DAY 45 tablet 1   nitroGLYCERIN (NITROSTAT) 0.4 MG SL tablet Place 1 tablet (0.4 mg total) under the tongue every 5 (five) minutes as needed for chest pain. 30 tablet 3   No current facility-administered medications for this visit.     Past Medical History:  Diagnosis Date   COPD (chronic obstructive pulmonary disease) (HCC)    Coronary artery disease    Hyperlipidemia     ROS:   All systems reviewed and negative except as noted in the HPI.   Past Surgical History:  Procedure Laterality Date   CHOLECYSTECTOMY     CORONARY STENT INTERVENTION N/A 05/31/2020   Procedure: CORONARY STENT INTERVENTION;  Surgeon: Yates Decamp, MD;  Location: [facility] INVASIVE CV LAB;  Service: Cardiovascular;  Laterality: N/A;   LEFT HEART CATH N/A  05/31/2020   Procedure: Left Heart Cath;  Surgeon: Yates Decamp, MD;  Location: [facility] INVASIVE CV LAB;  Service: Cardiovascular;  Laterality: N/A;   RIGHT/LEFT HEART CATH AND CORONARY ANGIOGRAPHY N/A 05/29/2020   Procedure: RIGHT/LEFT HEART CATH AND CORONARY ANGIOGRAPHY;  Surgeon: Yates Decamp, MD;  Location: [facility] INVASIVE CV LAB;  Service: Cardiovascular;  Laterality: N/A;     Family History  Problem Relation Age of Onset   Cancer Father    Heart attack Brother      Social History   Socioeconomic History   Marital status: Legally Separated    Spouse name: Not on file   Number of children: 2   Years of education: 12   Highest education level: High school graduate  Occupational History   Occupation: Nurse, mental health: [employer]  Tobacco Use   Smoking status: Former    Packs/day: 1.50    Years: 30.00    Pack years: 45.00    Types: Cigarettes    Quit date: 12/2019    Years since quitting: 1.0   Smokeless tobacco: Former    Types: Snuff   Tobacco comments:    Oct 2021, recently quit Chew also. Nicotine pouches-enoucraged to quit.    Vaping Use   Vaping Use: Never used  Substance and Sexual Activity   Alcohol use: Not Currently  Alcohol/week: 4.0 standard drinks    Types: 4 Shots of liquor per week    Comment: occ. social on weekends.   Drug use: Yes    Frequency: 7.0 times per week    Types: Marijuana   Sexual activity: Not on file  Other Topics Concern   Not on file  Social History Narrative   Not on file   Social Determinants of Health   Financial Resource Strain: Low Risk    Difficulty of Paying Living Expenses: Not hard at all  Food Insecurity: No Food Insecurity   Worried About Programme researcher, broadcasting/film/video in the Last Year: Never true   Ran Out of Food in the Last Year: Never true  Transportation Needs: No Transportation Needs   Lack of Transportation (Medical): No   Lack of Transportation (Non-Medical): No  Physical Activity: Sufficiently Active   Days  of Exercise per Week: 7 days   Minutes of Exercise per Session: 50 min  Stress: Not on file  Social Connections: Not on file  Intimate Partner Violence: Not on file     BP 128/70   Pulse 78   Ht 6' (1.829 m)   Wt 173 lb 12.8 oz (78.8 kg)   SpO2 96%   BMI 23.57 kg/m   Physical Exam:  Well appearing NAD HEENT: Unremarkable Neck:  6 cm JVD, no thyromegally Lymphatics:  No adenopathy Back:  No CVA tenderness Lungs:  Clear with no wheezes HEART:  Regular rate rhythm, no murmurs, no rubs, no clicks Abd:  soft, positive bowel sounds, no organomegally, no rebound, no guarding Ext:  2 plus pulses, no edema, no cyanosis, no clubbing Skin:  No rashes no nodules Neuro:  CN II through XII intact, motor grossly intact  EKG - reviewed NSR with LBBB   Assess/Plan:  Chronic systolic heart failure - his symptoms are well compensated and I have recommended he continue his current meds. I discussed the rationale for ICD insertion and he will call us if he wishes to proceed. Because his LBBB is intermittent and he has class 1 symptoms, a Biv ICD is not warranted. ICM - he denies anginal symptoms and is s/p PCI. COPD - he has stopped smoking and is not wheezing on exam today. Dyslipidemia - he will continue crestor.  Sharlot Gowda Jakaria Lavergne,MD

## 2020-12-26 ENCOUNTER — Other Ambulatory Visit: Payer: Self-pay | Admitting: Cardiology

## 2020-12-26 DIAGNOSIS — I255 Ischemic cardiomyopathy: Secondary | ICD-10-CM

## 2020-12-26 DIAGNOSIS — I5022 Chronic systolic (congestive) heart failure: Secondary | ICD-10-CM

## 2021-01-25 ENCOUNTER — Telehealth: Payer: Self-pay

## 2021-01-27 ENCOUNTER — Other Ambulatory Visit: Payer: Self-pay | Admitting: Cardiology

## 2021-01-27 DIAGNOSIS — E7800 Pure hypercholesterolemia, unspecified: Secondary | ICD-10-CM

## 2021-01-27 MED ORDER — ROSUVASTATIN CALCIUM 20 MG PO TABS: 20.0000 mg | ORAL_TABLET | Freq: Every day | ORAL | 3 refills | Status: DC

## 2021-01-29 ENCOUNTER — Other Ambulatory Visit: Payer: Self-pay | Admitting: Cardiology

## 2021-01-29 DIAGNOSIS — I5022 Chronic systolic (congestive) heart failure: Secondary | ICD-10-CM

## 2021-02-23 ENCOUNTER — Other Ambulatory Visit: Payer: Self-pay | Admitting: Cardiology

## 2021-03-14 ENCOUNTER — Ambulatory Visit: Payer: BC Managed Care – PPO

## 2021-03-14 ENCOUNTER — Other Ambulatory Visit: Payer: Self-pay

## 2021-03-14 DIAGNOSIS — I5022 Chronic systolic (congestive) heart failure: Secondary | ICD-10-CM

## 2021-03-14 DIAGNOSIS — I255 Ischemic cardiomyopathy: Secondary | ICD-10-CM | POA: Diagnosis not present

## 2021-03-18 NOTE — Progress Notes (Signed)
LEFS minimally improved from previous, will discuss at office visit soon.

## 2021-03-22 ENCOUNTER — Other Ambulatory Visit: Payer: Self-pay

## 2021-03-22 ENCOUNTER — Ambulatory Visit: Payer: BC Managed Care – PPO | Admitting: Cardiology

## 2021-03-22 ENCOUNTER — Encounter: Payer: Self-pay | Admitting: Cardiology

## 2021-03-22 VITALS — BP 124/62 | HR 83 | Temp 98.7°F | Resp 16 | Ht 72.0 in | Wt 168.0 lb

## 2021-03-22 DIAGNOSIS — I255 Ischemic cardiomyopathy: Secondary | ICD-10-CM | POA: Diagnosis not present

## 2021-03-22 DIAGNOSIS — E7800 Pure hypercholesterolemia, unspecified: Secondary | ICD-10-CM

## 2021-03-22 DIAGNOSIS — I447 Left bundle-branch block, unspecified: Secondary | ICD-10-CM | POA: Diagnosis not present

## 2021-03-22 DIAGNOSIS — I5022 Chronic systolic (congestive) heart failure: Secondary | ICD-10-CM | POA: Insufficient documentation

## 2021-03-22 MED ORDER — SPIRONOLACTONE 25 MG PO TABS: 12.5000 mg | ORAL_TABLET | Freq: Every day | ORAL | 3 refills | Status: DC

## 2021-03-22 NOTE — Progress Notes (Signed)
Primary Physician/Referring:  Pieter Partridge, PA  Patient ID: Jermaine Morris, male    DOB: [birth year], 61 y.o.   MRN: [id number]  Chief Complaint  Patient presents with   Cardiomyopathy   Coronary Artery Disease   Follow-up    3 months   HPI:    Jermaine Morris  is a 61 y.o. Caucasian male patient with COPD, interstitial lung disease by CXR 02/16/2020, GERD, referred to me for evaluation of dyspnea and abnormal EKG. past medical history significant for tobacco use disorder quit 3 months ago in September 2021, hyperlipidemia, ischemic cardiomyopathy with severe LV systolic dysfunction of 15 to 20%, EF is improved to around 25 to 30% by echocardiogram in January 2023, history of LAD stenting on 05/31/2020.  He is presently doing well and has not had any further episodes of orthostatic dizziness. He has mild chronic dyspnea and states that this has been stable and he denies any PND or orthopnea or leg edema.  He has not had any chest pain or palpitations.     Past Medical History:  Diagnosis Date   COPD (chronic obstructive pulmonary disease) ([place])    Coronary artery disease    Hyperlipidemia    Past Surgical History:  Procedure Laterality Date   CHOLECYSTECTOMY     CORONARY STENT INTERVENTION N/A 05/31/2020   Procedure: CORONARY STENT INTERVENTION;  Surgeon: Adrian Prows, MD;  Location: [facility];  Service: Cardiovascular;  Laterality: N/A;   LEFT HEART CATH N/A 05/31/2020   Procedure: Left Heart Cath;  Surgeon: Adrian Prows, MD;  Location: [facility];  Service: Cardiovascular;  Laterality: N/A;   RIGHT/LEFT HEART CATH AND CORONARY ANGIOGRAPHY N/A 05/29/2020   Procedure: RIGHT/LEFT HEART CATH AND CORONARY ANGIOGRAPHY;  Surgeon: Adrian Prows, MD;  Location: [facility];  Service: Cardiovascular;  Laterality: N/A;   Family History  Problem Relation Age of Onset   Cancer Father    Heart attack Brother     Social History   Tobacco Use   Smoking status: Former     Packs/day: 1.50    Years: 30.00    Pack years: 45.00    Types: Cigarettes    Quit date: 12/2019    Years since quitting: 1.2   Smokeless tobacco: Former    Types: Snuff   Tobacco comments:    Oct 2021, recently quit Chew also. Nicotine pouches-enoucraged to quit.    Substance Use Topics   Alcohol use: Yes    Alcohol/week: 4.0 standard drinks    Types: 4 Shots of liquor per week    Comment: occ. social on weekends.   Marital Status: Married  ROS  Review of Systems  Cardiovascular:  Positive for dyspnea on exertion. Negative for chest pain and leg swelling.  Gastrointestinal:  Negative for melena.  Objective  Blood pressure 124/62, pulse 83, temperature 98.7 F (37.1 C), temperature source Temporal, resp. rate 16, height 6' (1.829 m), weight 168 lb (76.2 kg), SpO2 96 %.  Vitals with BMI 03/22/2021 12/25/2020 12/14/2020  Height _0  _1  _2   Weight 168 lbs 173 lbs 13 oz 171 lbs 3 oz  BMI 22.78 92.11 94.17  Systolic 408 144 818  Diastolic 62 70 68  Pulse 83 78 69    Physical Exam Constitutional:      Appearance: Normal appearance. He is normal weight.  Cardiovascular:     Rate and Rhythm: Normal rate and regular rhythm. Extrasystoles are present.    Pulses: Intact distal  pulses.     Heart sounds: Normal heart sounds. No murmur heard.   No gallop.     Comments: No leg edema, no JVD. Pulmonary:     Effort: Pulmonary effort is normal.     Breath sounds: Normal breath sounds. No wheezing or rales.     Comments: Barrel shaped chest Abdominal:     General: Bowel sounds are normal.     Palpations: Abdomen is soft.   Laboratory examination:   Recent Labs    05/30/20 0337 05/31/20 0857 06/01/20 0311  NA 135 132* 131*  K 4.0 4.0 3.4*  CL 98 96* 97*  CO2 _0 GLUCOSE 132* 151* 145*  BUN 18 26* 23*  CREATININE 1.10 1.10 1.05  CALCIUM 9.3 8.7* 8.4*  GFRNONAA >60 >60 >60   CrCl cannot be calculated (Patient's most recent lab result is older than the maximum 21  days allowed.).  CMP Latest Ref Rng & Units 06/01/2020 05/31/2020 05/30/2020  Glucose 70 - 99 mg/dL 145(H) 151(H) 132(H)  BUN 6 - 20 mg/dL 23(H) 26(H) 18  Creatinine 0.61 - 1.24 mg/dL 1.05 1.10 1.10  Sodium 135 - 145 mmol/L 131(L) 132(L) 135  Potassium 3.5 - 5.1 mmol/L 3.4(L) 4.0 4.0  Chloride 98 - 111 mmol/L 97(L) 96(L) 98  CO2 22 - 32 mmol/L _1 Calcium 8.9 - 10.3 mg/dL 8.4(L) 8.7(L) 9.3  Total Protein 6.0 - 8.3 g/dL - - -  Total Bilirubin 0.3 - 1.2 mg/dL - - -  Alkaline Phos 39 - 117 U/L - - -  AST 0 - 37 U/L - - -  ALT 0 - 53 U/L - - -   CBC Latest Ref Rng & Units 06/01/2020 05/30/2020 05/29/2020  WBC 4.0 - 10.5 K/uL 12.4(H) 10.9(H) 10.9(H)  Hemoglobin 13.0 - 17.0 g/dL 17.8(H) 16.8 16.6  Hematocrit 39.0 - 52.0 % 47.6 46.9 46.3  Platelets 150 - 400 K/uL 292 254 274    Lipid Panel Recent Labs    08/02/20 1531  LDLDIRECT 66  Labs 04/05/2020:  Total cholesterol 206, triglycerides 74, HDL 59, LDL 146.  Non-HDL cholesterol 147. BNP    Component Value Date/Time   BNP 315.0 (H) 06/01/2020 0311   ProBNP No results found for: PROBNP  External labs:   04/05/2020: TSH normal.  Medications and allergies   Allergies  Allergen Reactions   Codeine Nausea And Vomiting    Outpatient Medications Prior to Visit  Medication Sig Dispense Refill   aspirin (ASPIRIN CHILDRENS) 81 MG chewable tablet Chew 1 tablet (81 mg total) by mouth daily.     carvedilol (COREG) 6.25 MG tablet Take 1 tablet (6.25 mg total) by mouth 2 (two) times daily. 180 tablet 1   clopidogrel (PLAVIX) 75 MG tablet TAKE 1 TABLET BY MOUTH EVERY DAY WITH BREAKFAST 90 tablet 1   ENTRESTO 24-26 MG TAKE 1 TABLET BY MOUTH TWICE A DAY 60 tablet 1   nitroGLYCERIN (NITROSTAT) 0.4 MG SL tablet Place 1 tablet (0.4 mg total) under the tongue every 5 (five) minutes as needed for chest pain. 30 tablet 3   rosuvastatin (CRESTOR) 20 MG tablet Take 1 tablet (20 mg total) by mouth daily. 90 tablet 3   spironolactone (ALDACTONE)  25 MG tablet TAKE 1/2 TABLET BY MOUTH EVERY DAY 45 tablet 1   Multiple Vitamins-Minerals (ONE-A-DAY MENS 50+) TABS Take 1 tablet by mouth daily.     No facility-administered medications prior to visit.   Radiology:  Chest x-ray 05/30/2018:  The heart size and mediastinal contours are unchanged. No focal consolidation. No pulmonary edema. No pleural effusion. No pneumothorax. No acute osseous abnormality.  Chest x-ray PA and lateral view 02/16/2020: Cardiovascular: Cardiac silhouette and pulmonary vasculature are within normal limits.  Mediastinum: Within normal limits.  Lungs/pleura: No pneumothorax or pleural effusion. Coarse interstitial and reticular opacities noted throughout both lungs. No consolidation or focal airspace opacities.  CT of the abdomen 2015: Aortic atherosclerosis, no aneurysm.  Cardiac Studies:     PCV MYOCARDIAL PERFUSION WITH LEXISCAN 05/02/2020 Lexiscan nuclear stress test performed using 1-day protocol. SPECT images show large LV cavity with global decrease in counts. In addition, there is medium sized, severe intensity, predominantly fixed perfusion defect in anterior apical myocardium. Global decrease in wall motion and thickening. Stress LVEF 13%. High risk study.  Right and left heart catheterization 05/29/20  RA 14/15, mean 11 mmHg. RV 61/7, EDP 14 mmHg. PA 63/27, mean 48 mmHg.  PA saturation 68%. PW 37/34, mean 32 mmHg.  Aortic saturation 98%. Cardiac output 4.28, cardiac index 2.13 by Fick.   LV: 134/24, EDP 37 mmHg.  There is no pressure gradient across the aortic valve. LM: Large vessel, has mild calcification. LAD: Proximal segment at septal perforator 1 has a 70 to 80% stenosis.  Following this there is 100% occlusion with ipsilateral and bridging collaterals to the LAD, short segment occlusion.  Large D1 and D2.  Diffuse mild coronary calcification is evident. CX: Large caliber vessel.  Gives origin to 2 large marginals.  Mild diffuse disease. RCA:  Large-caliber vessel.  Mild disease is present.   Impression: Patient is in acute decompensated heart failure with severely elevated LVEDP and pulmonary capillary wedge pressure.  Patient is also diaphoretic and has not been feeling well for the last few days.  He will need to be admitted to the hospital for diuresis and stabilization.  LAD is very short segment occlusion and appears to be amenable for revascularization, inview of of very young age, still worthwhile to proceed with angioplasty to see whether there will be remodeling of his LV with revascularization when stable hemodynamically.  30 mL contrast utilized.  Coronary angioplasty 05/31/2020: Successful stenting of the functionally occluded proximal LAD with implantation of a 3.5 x 18 mm resolute DES, due to edge dissection a 3.0 x 12 mm resolute Onyx DES was deployed.  Distal stent postdilated with the same stent balloon overlapping 2 stents at 16 atmospheric pressure and proximal stent postdilated with a 3.5 mm x 10 mm [place] at 16 atmospheric pressure. Stenosis reduced from 100% to 0%, TIMI I flow to TIMI III flow at the end of the procedure.  50 mL contrast utilized.   Recommendation: Upon presentation to the Cath Lab, as patient received sedation his blood pressure was very soft.  Hence patient received IV bolus of 250 mL as his LVEDP was 0 mmHg.  Closing pressure 80/60 mmHg.  Patient completely asymptomatic and in no distress.  Will hold a.m. dose of Lasix and parameters on blood pressure medications.   PCV ECHOCARDIOGRAM COMPLETE 03/14/2021  Severely depressed LV systolic function with visual EF 25-30%. Left ventricle cavity is borderline dilated. Normal left ventricular wall thickness. Hypokinetic global wall motion. Doppler evidence of grade I (impaired) diastolic dysfunction, normal LAP. Mild (Grade I) mitral regurgitation. Mild tricuspid regurgitation. No evidence of pulmonary hypertension. Compared to study 08/27/2020:  minimal change in LVEF from 20-25% to 25-30%, moderate MR is now mild, otherwise no significant change.   EKG:  EKG 03/22/2021: Normal sinus rhythm at rate of 81 bpm, left bundle branch block.  And started no further analysis.  Single PVC, and capture beat.  Assessment     ICD-10-CM   1. Ischemic cardiomyopathy  I25.5 EKG 12-Lead    Pro b natriuretic peptide (BNP)    High sensitivity CRP    CMP14+EGFR    2. Chronic systolic heart failure (HCC)  I50.22     3. LBBB (left bundle branch block)  I44.7     4. Hypercholesteremia  E78.00 Lipid Panel With LDL/HDL Ratio    LDL cholesterol, direct      Medications Discontinued During This Encounter  Medication Reason   Multiple Vitamins-Minerals (ONE-A-DAY MENS 50+) TABS    spironolactone (ALDACTONE) 25 MG tablet Reorder     Meds ordered this encounter  Medications   spironolactone (ALDACTONE) 25 MG tablet    Sig: Take 0.5 tablets (12.5 mg total) by mouth daily.    Dispense:  50 tablet    Refill:  3    Orders Placed This Encounter  Procedures   Pro b natriuretic peptide (BNP)   Lipid Panel With LDL/HDL Ratio   LDL cholesterol, direct   High sensitivity CRP   CMP14+EGFR   EKG 12-Lead   Recommendations:   Sylar Voong is a 61 y.o. Caucasian male patient with COPD, interstitial lung disease by CXR 02/16/2020, GERD, referred to me for evaluation of dyspnea and abnormal EKG. past medical history significant for tobacco use disorder quit 3 months ago in September 2021, hyperlipidemia, ischemic cardiomyopathy with severe LV systolic dysfunction of 15 to 20%, EF is improved to around 25 to 30% by echocardiogram in January 2023, history of LAD stenting on 05/31/2020.  He is presently doing well and has not had any further episodes of orthostatic dizziness.  He is tolerating moderate dose Entresto and also carvedilol.  We will try to uptitrate carvedilol to 12.5 mg twice daily and he will let me know if he is able to tolerate statins and  a new prescription.  Instructions given on paper of how to increase carvedilol.  He has mild chronic dyspnea, class II.  No active heart failure. He was evaluated by EP but patient prefers not to have an ICD done.  He has been compliant with all his medications, will discontinue clopidogrel once he is done with last prescription.    Repeat his labs, I will consider him placing him in clinical trials for heart failure, I will see him back in 6 months for follow-up.    Adrian Prows, PA-C 03/22/2021, 10:01 AM Office: [phone]

## 2021-03-23 LAB — PRO B NATRIURETIC PEPTIDE: NT-Pro BNP: 324 pg/mL — ABNORMAL HIGH (ref 0–210)

## 2021-03-23 LAB — CMP14+EGFR
ALT: 20 IU/L (ref 0–44)
AST: 20 IU/L (ref 0–40)
Albumin/Globulin Ratio: 2.3 — ABNORMAL HIGH (ref 1.2–2.2)
Albumin: 4.5 g/dL (ref 3.8–4.9)
Alkaline Phosphatase: 92 IU/L (ref 44–121)
BUN/Creatinine Ratio: 14 (ref 9–20)
BUN: 14 mg/dL (ref 6–24)
Bilirubin Total: 0.7 mg/dL (ref 0.0–1.2)
CO2: 25 mmol/L (ref 20–29)
Calcium: 9.4 mg/dL (ref 8.7–10.2)
Chloride: 99 mmol/L (ref 96–106)
Creatinine, Ser: 1 mg/dL (ref 0.76–1.27)
Globulin, Total: 2 g/dL (ref 1.5–4.5)
Glucose: 93 mg/dL (ref 70–99)
Potassium: 4.7 mmol/L (ref 3.5–5.2)
Sodium: 139 mmol/L (ref 134–144)
Total Protein: 6.5 g/dL (ref 6.0–8.5)
eGFR: 87 mL/min/{1.73_m2} (ref >59)

## 2021-03-23 LAB — HIGH SENSITIVITY CRP: CRP, High Sensitivity: 0.49 mg/L (ref 0.00–3.00)

## 2021-03-23 LAB — LDL CHOLESTEROL, DIRECT: LDL Direct: 70 mg/dL (ref 0–99)

## 2021-03-23 LAB — LIPID PANEL WITH LDL/HDL RATIO
Cholesterol, Total: 148 mg/dL (ref 100–199)
HDL: 66 mg/dL (ref >39)
LDL Chol Calc (NIH): 69 mg/dL (ref 0–99)
LDL/HDL Ratio: 1 ratio (ref 0.0–3.6)
Triglycerides: 65 mg/dL (ref 0–149)
VLDL Cholesterol Cal: 13 mg/dL (ref 5–40)

## 2021-03-25 NOTE — Progress Notes (Signed)
Please see if he would qualify for the trials we have

## 2021-03-26 NOTE — Progress Notes (Signed)
ZEUS trial with elevated CRP

## 2021-04-07 ENCOUNTER — Other Ambulatory Visit: Payer: Self-pay | Admitting: Cardiology

## 2021-04-07 DIAGNOSIS — I5022 Chronic systolic (congestive) heart failure: Secondary | ICD-10-CM

## 2021-04-22 ENCOUNTER — Other Ambulatory Visit: Payer: Self-pay | Admitting: Cardiology

## 2021-04-22 DIAGNOSIS — I5022 Chronic systolic (congestive) heart failure: Secondary | ICD-10-CM

## 2021-06-05 ENCOUNTER — Other Ambulatory Visit: Payer: Self-pay | Admitting: Cardiology

## 2021-06-05 DIAGNOSIS — I5022 Chronic systolic (congestive) heart failure: Secondary | ICD-10-CM

## 2021-07-19 ENCOUNTER — Other Ambulatory Visit: Payer: Self-pay

## 2021-07-19 ENCOUNTER — Telehealth: Payer: Self-pay | Admitting: Cardiology

## 2021-07-19 DIAGNOSIS — I5022 Chronic systolic (congestive) heart failure: Secondary | ICD-10-CM

## 2021-07-19 MED ORDER — CARVEDILOL 6.25 MG PO TABS: 6.2500 mg | ORAL_TABLET | Freq: Two times a day (BID) | ORAL | 1 refills | Status: DC

## 2021-07-19 MED ORDER — ENTRESTO 24-26 MG PO TABS: 1.0000 | ORAL_TABLET | Freq: Two times a day (BID) | ORAL | 1 refills | Status: DC

## 2021-07-19 NOTE — Telephone Encounter (Signed)
Patient requesting refill for entresto and carvedilol. Patient will be out of his entresto tomorrow. ?

## 2021-07-19 NOTE — Telephone Encounter (Signed)
Done

## 2021-09-19 ENCOUNTER — Ambulatory Visit: Payer: BC Managed Care – PPO | Admitting: Cardiology

## 2021-09-19 ENCOUNTER — Other Ambulatory Visit: Payer: Self-pay | Admitting: Cardiology

## 2021-09-19 ENCOUNTER — Encounter: Payer: Self-pay | Admitting: Cardiology

## 2021-09-19 VITALS — BP 110/70 | HR 75 | Temp 97.5°F | Resp 16 | Ht 72.0 in | Wt 166.2 lb

## 2021-09-19 DIAGNOSIS — I255 Ischemic cardiomyopathy: Secondary | ICD-10-CM | POA: Diagnosis not present

## 2021-09-19 DIAGNOSIS — I5022 Chronic systolic (congestive) heart failure: Secondary | ICD-10-CM

## 2021-09-19 DIAGNOSIS — I493 Ventricular premature depolarization: Secondary | ICD-10-CM

## 2021-09-19 DIAGNOSIS — I447 Left bundle-branch block, unspecified: Secondary | ICD-10-CM

## 2021-09-19 MED ORDER — FISH OIL 1200 MG PO CAPS: 2.0000 | ORAL_CAPSULE | Freq: Every day | ORAL | 3 refills | Status: DC

## 2021-09-19 MED ORDER — SAFFLOWER OIL-VITAMIN B6 1150-12 MG PO CAPS: 1.0000 | ORAL_CAPSULE | Freq: Every day | ORAL | 3 refills | Status: DC

## 2021-09-19 NOTE — Progress Notes (Signed)
Primary Physician/Referring:  Roderick Pee, PA (Inactive)  Patient ID: Jermaine Morris, male    DOB: 05/18/1960, 61 y.o.   MRN: [id number]  Chief Complaint  Patient presents with   Congestive Heart Failure    6 month   HPI:    Jermaine Morris  is a 61 y.o. Caucasian male patient with COPD, interstitial lung disease by CXR 02/16/2020, GERD, referred to me for evaluation of dyspnea and abnormal EKG. past medical history significant for tobacco use disorder quit 3 months ago in September 2021, hyperlipidemia, ischemic cardiomyopathy with severe LV systolic dysfunction of 15 to 27%, EF is improved to around 25 to 30% by echocardiogram in January 2023, history of LAD stenting on 05/31/2020.  He is presently doing well and essentially asymptomatic. Past Medical History:  Diagnosis Date   COPD (chronic obstructive pulmonary disease) (HCC)    Coronary artery disease    Hyperlipidemia    Past Surgical History:  Procedure Laterality Date   CHOLECYSTECTOMY     CORONARY STENT INTERVENTION N/A 05/31/2020   Procedure: CORONARY STENT INTERVENTION;  Surgeon: Yates Decamp, MD;  Location: [facility] INVASIVE CV LAB;  Service: Cardiovascular;  Laterality: N/A;   LEFT HEART CATH N/A 05/31/2020   Procedure: Left Heart Cath;  Surgeon: Yates Decamp, MD;  Location: [facility] INVASIVE CV LAB;  Service: Cardiovascular;  Laterality: N/A;   RIGHT/LEFT HEART CATH AND CORONARY ANGIOGRAPHY N/A 05/29/2020   Procedure: RIGHT/LEFT HEART CATH AND CORONARY ANGIOGRAPHY;  Surgeon: Yates Decamp, MD;  Location: [facility] INVASIVE CV LAB;  Service: Cardiovascular;  Laterality: N/A;   Family History  Problem Relation Age of Onset   Cancer Father    Heart attack Brother     Social History   Tobacco Use   Smoking status: Former    Packs/day: 1.50    Years: 30.00    Total pack years: 45.00    Types: Cigarettes    Quit date: 12/2019    Years since quitting: 1.7   Smokeless tobacco: Former    Types: Snuff   Tobacco comments:    Oct 2021,  recently quit Chew also. Nicotine pouches-enoucraged to quit.    Substance Use Topics   Alcohol use: Yes    Alcohol/week: 4.0 standard drinks of alcohol    Types: 4 Shots of liquor per week    Comment: occ. social on weekends.   Marital Status: Married  ROS  Review of Systems  Cardiovascular:  Positive for dyspnea on exertion. Negative for chest pain and leg swelling.  Gastrointestinal:  Negative for melena.   Objective  Blood pressure 110/70, pulse 75, temperature (!) 97.5 F (36.4 C), temperature source Temporal, resp. rate 16, height 6' (1.829 m), weight 166 lb 3.2 oz (75.4 kg), SpO2 96 %.     09/19/2021    1:45 PM 03/22/2021    9:05 AM 12/25/2020    9:53 AM  Vitals with BMI  Height 6\' 0"  6\' 0"  6\' 0"   Weight 166 lbs 3 oz 168 lbs 173 lbs 13 oz  BMI 22.54 22.78 23.57  Systolic 110 124  Diastolic 70 62 70  Pulse 75 83 78    Physical Exam Constitutional:      Appearance: Normal appearance. He is normal weight.  Cardiovascular:     Rate and Rhythm: Normal rate and regular rhythm. Extrasystoles are present.    Pulses: Intact distal pulses.     Heart sounds: Normal heart sounds. No murmur heard.    No gallop.  Comments: No leg edema, no JVD. Pulmonary:     Effort: Pulmonary effort is normal.     Breath sounds: Normal breath sounds. No wheezing or rales.     Comments: Barrel shaped chest Abdominal:     General: Bowel sounds are normal.     Palpations: Abdomen is soft.    Laboratory examination:   Recent Labs    03/22/21 1011  NA 139  K 4.7  CL 99  CO2 25  GLUCOSE 93  BUN 14  CREATININE 1.00  CALCIUM 9.4   CrCl cannot be calculated (Patient's most recent lab result is older than the maximum 21 days allowed.).     Latest Ref Rng & Units 03/22/2021   10:11 AM 06/01/2020    3:11 AM 05/31/2020    8:57 AM  CMP  Glucose 70 - 99 mg/dL 93  145  151   BUN 6 - 24 mg/dL 14  23  26    Creatinine 0.76 - 1.27 mg/dL 1.00  1.05  1.10   Sodium 134 - 144 mmol/L 139   131  132   Potassium 3.5 - 5.2 mmol/L 4.7  3.4  4.0   Chloride 96 - 106 mmol/L 99  97  96   CO2 20 - 29 mmol/L 25  26  25    Calcium 8.7 - 10.2 mg/dL 9.4  8.4  8.7   Total Protein 6.0 - 8.5 g/dL 6.5     Total Bilirubin 0.0 - 1.2 mg/dL 0.7     Alkaline Phos 44 - 121 IU/L 92     AST 0 - 40 IU/L 20     ALT 0 - 44 IU/L 20         Latest Ref Rng & Units 06/01/2020    3:11 AM 05/30/2020    3:37 AM 05/29/2020    7:29 PM  CBC  WBC 4.0 - 10.5 K/uL 12.4  10.9  10.9   Hemoglobin 13.0 - 17.0 g/dL 17.8  16.8  16.6   Hematocrit 39.0 - 52.0 % 47.6  46.9  46.3   Platelets 150 - 400 K/uL 292  254  274     Lipid Panel Recent Labs    03/22/21 1011  CHOL 148  TRIG 65  LDLCALC 69  HDL 66  LDLDIRECT 70  Labs 04/05/2020:  Total cholesterol 206, triglycerides 74, HDL 59, LDL 146.  Non-HDL cholesterol 147. BNP    Component Value Date/Time   BNP 315.0 (H) 06/01/2020 0311   ProBNP    Component Value Date/Time   PROBNP 324 (H) 03/22/2021 1011    External labs:   04/05/2020: TSH normal.  Medications and allergies   Allergies  Allergen Reactions   Codeine Nausea And Vomiting   Current Outpatient Medications:    aspirin (ASPIRIN CHILDRENS) 81 MG chewable tablet, Chew 1 tablet (81 mg total) by mouth daily., Disp: , Rfl:    carvedilol (COREG) 6.25 MG tablet, Take 1 tablet (6.25 mg total) by mouth 2 (two) times daily. (Patient taking differently: Take 12.5 mg by mouth 2 (two) times daily.), Disp: 180 tablet, Rfl: 1   Omega-3 Fatty Acids (FISH OIL) 1200 MG CAPS, Take 2 capsules (2,400 mg total) by mouth daily after lunch., Disp: 180 capsule, Rfl: 3   rosuvastatin (CRESTOR) 20 MG tablet, Take 1 tablet (20 mg total) by mouth daily., Disp: 90 tablet, Rfl: 3   sacubitril-valsartan (ENTRESTO) 24-26 MG, Take 1 tablet by mouth 2 (two) times daily., Disp: 180 tablet, Rfl: 1  Safflower Oil-Vitamin B6 1150-12 MG CAPS, Take 1 capsule by mouth daily after breakfast., Disp: 90 capsule, Rfl: 3    spironolactone (ALDACTONE) 25 MG tablet, Take 0.5 tablets (12.5 mg total) by mouth daily., Disp: 50 tablet, Rfl: 3   nitroGLYCERIN (NITROSTAT) 0.4 MG SL tablet, Place 1 tablet (0.4 mg total) under the tongue every 5 (five) minutes as needed for chest pain., Disp: 30 tablet, Rfl: 3     Radiology:  Chest x-ray 05/30/2018: The heart size and mediastinal contours are unchanged. No focal consolidation. No pulmonary edema. No pleural effusion. No pneumothorax. No acute osseous abnormality.  Chest x-ray PA and lateral view 02/16/2020: Cardiovascular: Cardiac silhouette and pulmonary vasculature are within normal limits.  Mediastinum: Within normal limits.  Lungs/pleura: No pneumothorax or pleural effusion. Coarse interstitial and reticular opacities noted throughout both lungs. No consolidation or focal airspace opacities.  CT of the abdomen 2015: Aortic atherosclerosis, no aneurysm.  Cardiac Studies:     PCV MYOCARDIAL PERFUSION WITH LEXISCAN 05/02/2020 Lexiscan nuclear stress test performed using 1-day protocol. SPECT images show large LV cavity with global decrease in counts. In addition, there is medium sized, severe intensity, predominantly fixed perfusion defect in anterior apical myocardium. Global decrease in wall motion and thickening. Stress LVEF 13%. High risk study.  Right and left heart catheterization 05/29/20  RA 14/15, mean 11 mmHg. RV 61/7, EDP 14 mmHg. PA 63/27, mean 48 mmHg.  PA saturation 68%. PW 37/34, mean 32 mmHg.  Aortic saturation 98%. Cardiac output 4.28, cardiac index 2.13 by Fick.   LV: 134/24, EDP 37 mmHg.  There is no pressure gradient across the aortic valve. LM: Large vessel, has mild calcification. LAD: Proximal segment at septal perforator 1 has a 70 to 80% stenosis.  Following this there is 100% occlusion with ipsilateral and bridging collaterals to the LAD, short segment occlusion.  Large D1 and D2.  Diffuse mild coronary calcification is evident. CX: Large  caliber vessel.  Gives origin to 2 large marginals.  Mild diffuse disease. RCA: Large-caliber vessel.  Mild disease is present.   Impression: Patient is in acute decompensated heart failure with severely elevated LVEDP and pulmonary capillary wedge pressure.  Patient is also diaphoretic and has not been feeling well for the last few days.  He will need to be admitted to the hospital for diuresis and stabilization.  LAD is very short segment occlusion and appears to be amenable for revascularization, inview of of very young age, still worthwhile to proceed with angioplasty to see whether there will be remodeling of his LV with revascularization when stable hemodynamically.  30 mL contrast utilized.  Coronary angioplasty 05/31/2020: Successful stenting of the functionally occluded proximal LAD with implantation of a 3.5 x 18 mm resolute DES, due to edge dissection a 3.0 x 12 mm resolute Onyx DES was deployed.  Distal stent postdilated with the same stent balloon overlapping 2 stents at 16 atmospheric pressure and proximal stent postdilated with a 3.5 mm x 10 mm Sapphire Dougherty at 16 atmospheric pressure. Stenosis reduced from 100% to 0%, TIMI I flow to TIMI III flow at the end of the procedure.  50 mL contrast utilized.   Recommendation: Upon presentation to the Cath Lab, as patient received sedation his blood pressure was very soft.  Hence patient received IV bolus of 250 mL as his LVEDP was 0 mmHg.  Closing pressure 80/60 mmHg.  Patient completely asymptomatic and in no distress.  Will hold a.m. dose of Lasix and parameters on blood  pressure medications.   PCV ECHOCARDIOGRAM COMPLETE 03/14/2021  Severely depressed LV systolic function with visual EF 25-30%. Left ventricle cavity is borderline dilated. Normal left ventricular wall thickness. Hypokinetic global wall motion. Doppler evidence of grade I (impaired) diastolic dysfunction, normal LAP. Mild (Grade I) mitral regurgitation. Mild tricuspid  regurgitation. No evidence of pulmonary hypertension. Compared to study 08/27/2020: minimal change in LVEF from 20-25% to 25-30%, moderate MR is now mild, otherwise no significant change.   EKG:   EKG 09/19/2021: Normal sinus rhythm at rate of 89 bpm, left atrial enlargement, normal axis.  Poor R progression, probably normal variant.  Frequent PVCs (3) in bigeminal pattern.  No significant change from 03/22/2021.  Previously noted left bundle branch block is no longer present.  Assessment     ICD-10-CM   1. Ischemic cardiomyopathy  I25.5 EKG 12-Lead    PCV ECHOCARDIOGRAM COMPLETE    2. Chronic systolic heart failure (HCC)  I50.22 PCV ECHOCARDIOGRAM COMPLETE    3. LBBB (left bundle branch block)  I44.7 Omega-3 Fatty Acids (FISH OIL) 1200 MG CAPS    4. PVC (premature ventricular contraction)  I49.3 Omega-3 Fatty Acids (FISH OIL) 1200 MG CAPS    Safflower Oil-Vitamin B6 1150-12 MG CAPS      Medications Discontinued During This Encounter  Medication Reason   clopidogrel (PLAVIX) 75 MG tablet Completed Course     Meds ordered this encounter  Medications   Omega-3 Fatty Acids (FISH OIL) 1200 MG CAPS    Sig: Take 2 capsules (2,400 mg total) by mouth daily after lunch.    Dispense:  180 capsule    Refill:  3   Safflower Oil-Vitamin B6 1150-12 MG CAPS    Sig: Take 1 capsule by mouth daily after breakfast.    Dispense:  90 capsule    Refill:  3    Orders Placed This Encounter  Procedures   EKG 12-Lead   PCV ECHOCARDIOGRAM COMPLETE    Standing Status:   Future    Standing Expiration Date:   09/20/2022   Recommendations:   Jamer Schoonover is a 61 y.o. Caucasian male patient with COPD, interstitial lung disease by CXR 02/16/2020, GERD, referred to me for evaluation of dyspnea and abnormal EKG. past medical history significant for tobacco use disorder quit 3 months ago in September 2021, hyperlipidemia, ischemic cardiomyopathy with severe LV systolic dysfunction of 15 to 20%, EF is  improved to around 25 to 30% by echocardiogram in January 2023, history of LAD stenting on 05/31/2020.  He is presently doing well and essentially asymptomatic.  He is not sure whether he is taking 12.5 mg of carvedilol or 6.25 mg apart from without all the medications where appropriate.  He has not been able to tolerate higher doses of the medication due to soft blood pressure and dizziness.  Today the EKG does not reveal underlying left bundle branch block.  It may hopefully mean that his LVEF may be improving.  I will repeat echocardiogram.  For frequent PVCs, I will empirically try fish oil capsules and also B6 and B12 supplements for the PVCs.  Unless problems I will see him back in 6 months.  Lipids are well controlled.  No clinical evidence of heart failure.   Adrian Prows, PA-C 09/19/2021, 2:52 PM Office: [phone]

## 2021-09-20 ENCOUNTER — Other Ambulatory Visit: Payer: Self-pay

## 2021-09-20 MED ORDER — CARVEDILOL 12.5 MG PO TABS: 12.5000 mg | ORAL_TABLET | Freq: Two times a day (BID) | ORAL | 3 refills | Status: DC

## 2021-12-08 IMAGING — CT CT CARDIAC CORONARY ARTERY CALCIUM SCORE
3 series · 14 of 20 positions shown, 16 images · non-contrast
Comparison: None.

CLINICAL DATA: Hyperlipidemia

EXAM:
CT CARDIAC CORONARY ARTERY CALCIUM SCORE
TECHNIQUE: Non-contrast imaging through the heart was performed using
prospective ECG gating. Image post processing was performed on an
independent workstation, allowing for quantitative analysis of the
heart and coronary arteries. Note that this exam targets the heart
and the chest was not imaged in its entirety.

[Series 2: calcium scoring 2.00 qr36 bestdiast 60% hrt calciu · axial · 0.38mm/px · z∈[+1517,+1611]mm · 4 of 79 slices shown]
[im 16/79  vessel]
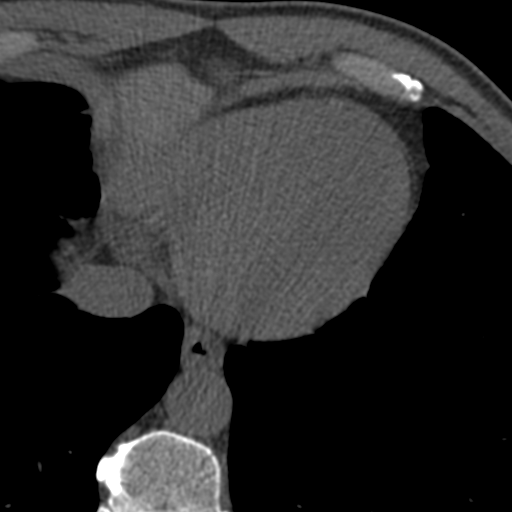
[im 32/79  vessel]
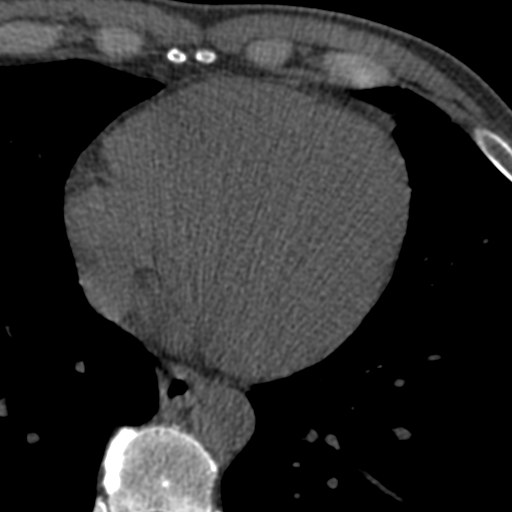
[im 47/79  vessel]
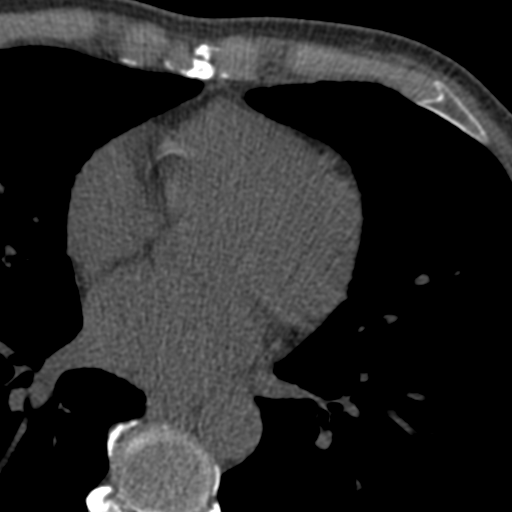
[im 63/79  vessel]
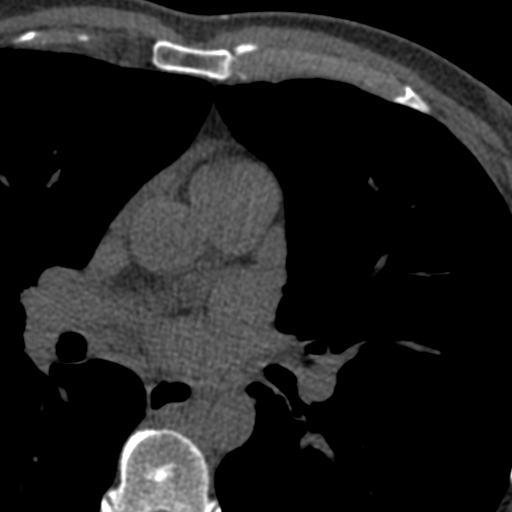

[Series 3: calcium scoring 2.00 br40 bestdiast 60% axial · axial · 0.56mm/px · z∈[+1512,+1616]mm · 5 of 80 slices shown, 7 images]
[im 14/80  vessel]
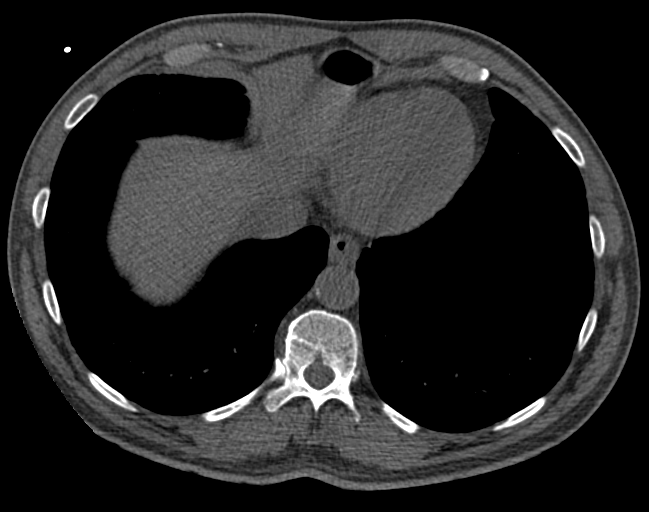
[im 14/80  lung]
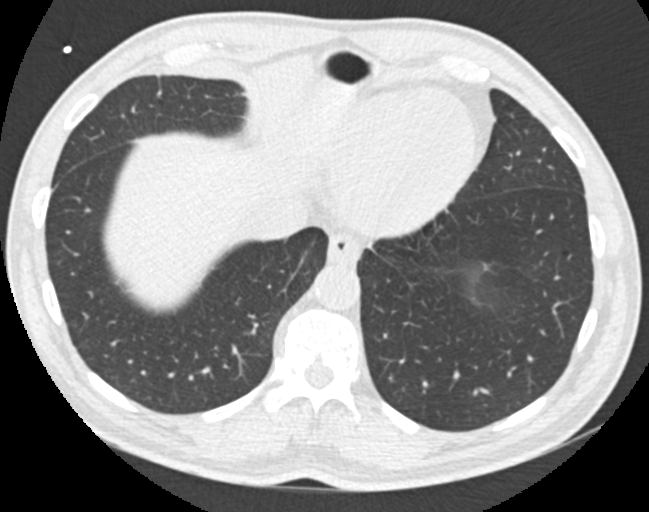
[im 27/80  vessel]
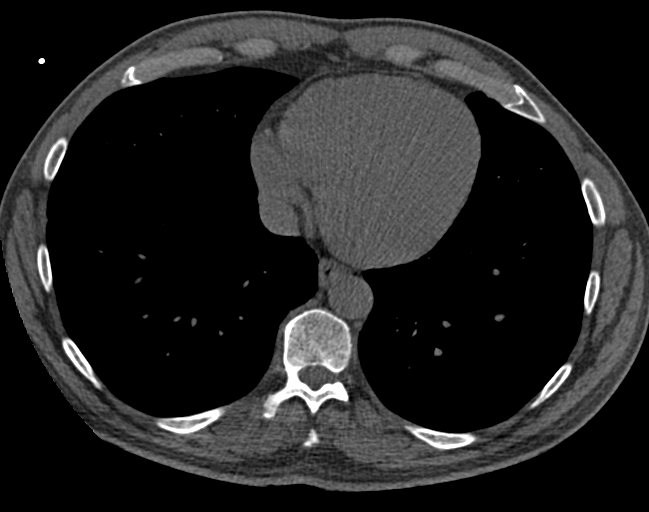
[im 40/80  vessel]
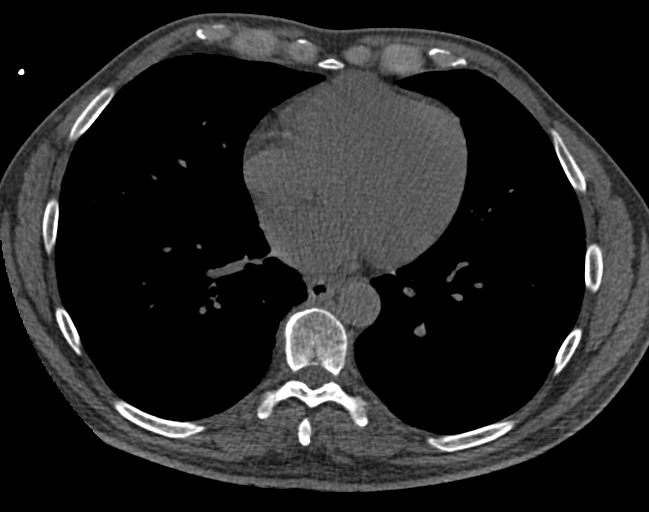
[im 53/80  vessel]
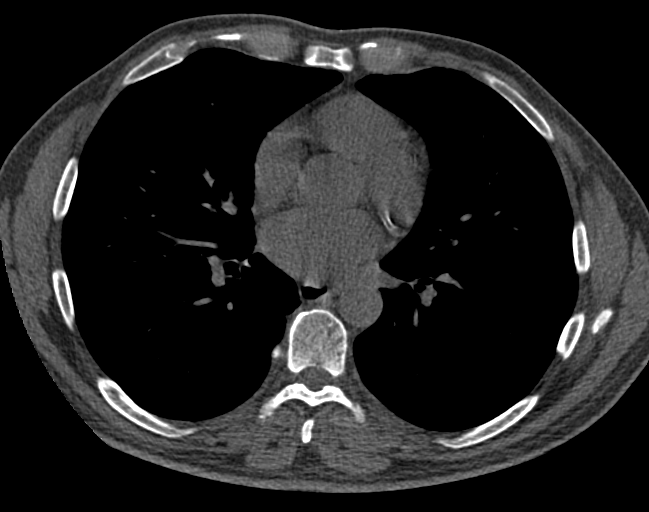
[im 66/80  vessel]
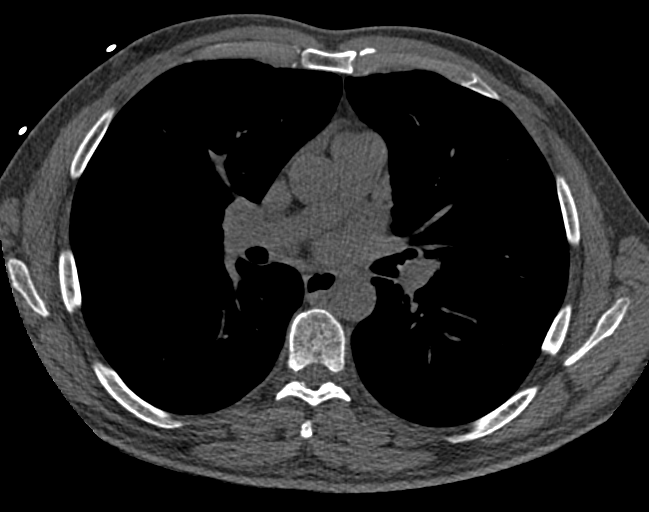
[im 66/80  lung]
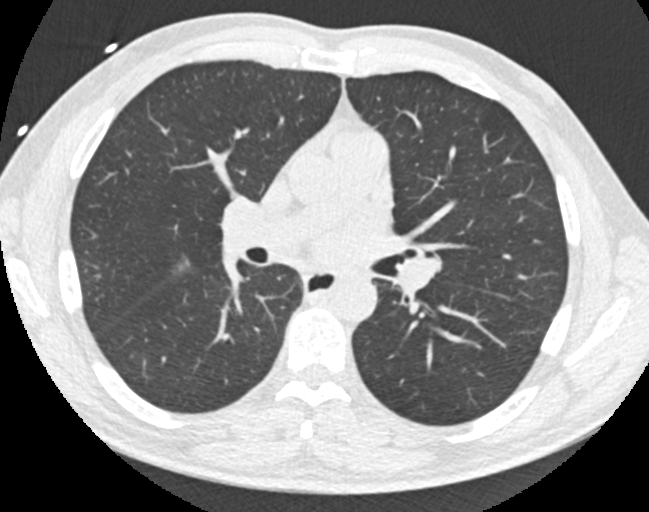

[Series 9: calcium scoring 2.00 br60 bestdiast 60% lungs · axial · 0.56mm/px · z∈[+1513,+1617]mm · 5 of 79 slices shown]
[im 14/79  vessel]
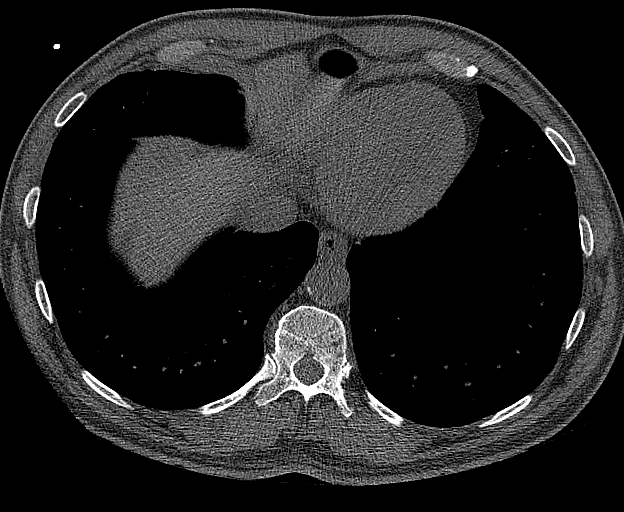
[im 27/79  vessel]
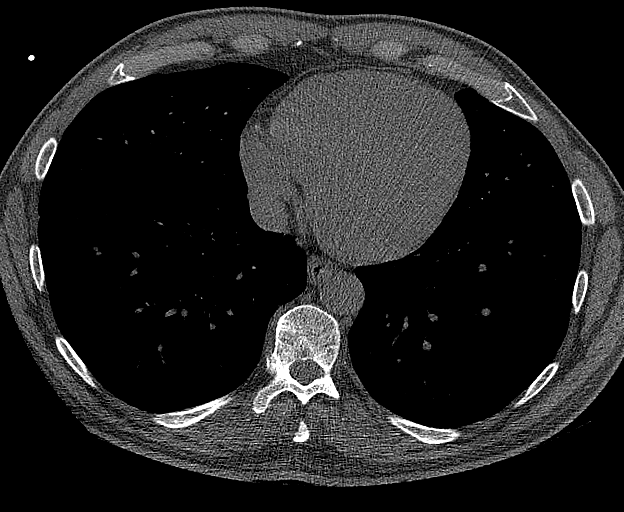
[im 40/79  vessel]
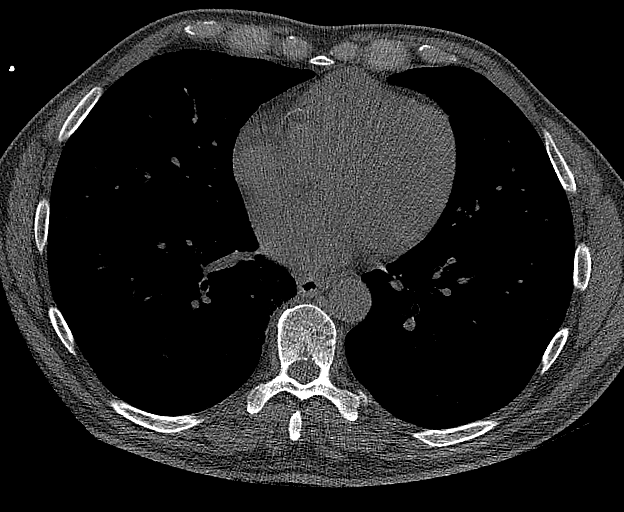
[im 53/79  vessel]
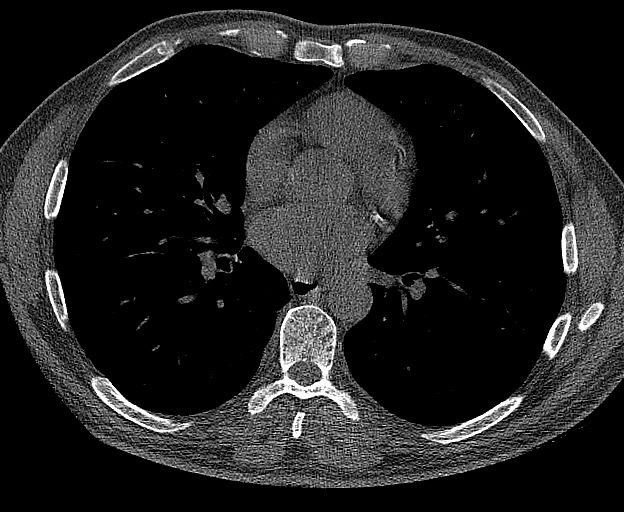
[im 66/79  vessel]
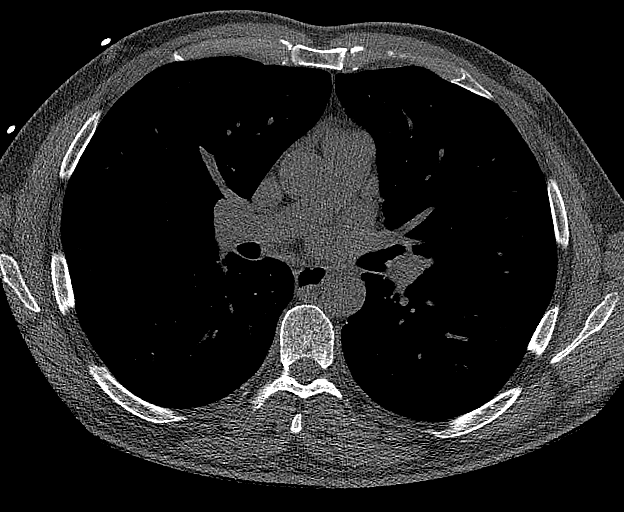

[14 of 20 positions shown; findings below may reference images not displayed]

FINDINGS: CORONARY CALCIUM SCORES:

Left Main: 0

LAD: 623

LCx: 228

RCA:

Total Agatston Score: 854

[HOSPITAL] percentile: 96

AORTA MEASUREMENTS:

Ascending Aorta: 28 mm

Descending Aorta: 24 mm

OTHER FINDINGS:

Heart is normal size. Aorta normal caliber. No adenopathy. No
confluent opacities or effusions. Imaging into the upper abdomen
demonstrates no acute findings. Chest wall soft tissues are
unremarkable. No acute bony abnormality.
IMPRESSION: Total Agatston score: 854

[HOSPITAL] percentile: 96

No acute or significant extracardiac abnormality.

## 2022-02-07 ENCOUNTER — Other Ambulatory Visit: Payer: Self-pay | Admitting: Cardiology

## 2022-02-07 DIAGNOSIS — E7800 Pure hypercholesterolemia, unspecified: Secondary | ICD-10-CM

## 2022-03-13 ENCOUNTER — Other Ambulatory Visit: Payer: BC Managed Care – PPO

## 2022-03-14 ENCOUNTER — Other Ambulatory Visit: Payer: Self-pay | Admitting: Cardiology

## 2022-03-21 ENCOUNTER — Ambulatory Visit: Payer: BC Managed Care – PPO

## 2022-03-21 DIAGNOSIS — I255 Ischemic cardiomyopathy: Secondary | ICD-10-CM

## 2022-03-21 DIAGNOSIS — I5022 Chronic systolic (congestive) heart failure: Secondary | ICD-10-CM | POA: Diagnosis not present

## 2022-03-27 ENCOUNTER — Ambulatory Visit: Payer: BC Managed Care – PPO | Admitting: Cardiology

## 2022-03-27 ENCOUNTER — Encounter: Payer: Self-pay | Admitting: Cardiology

## 2022-03-27 VITALS — BP 119/54 | HR 73 | Resp 18 | Ht 72.0 in | Wt 165.6 lb

## 2022-03-27 DIAGNOSIS — I493 Ventricular premature depolarization: Secondary | ICD-10-CM | POA: Diagnosis not present

## 2022-03-27 DIAGNOSIS — I2510 Atherosclerotic heart disease of native coronary artery without angina pectoris: Secondary | ICD-10-CM

## 2022-03-27 DIAGNOSIS — J8410 Pulmonary fibrosis, unspecified: Secondary | ICD-10-CM | POA: Diagnosis not present

## 2022-03-27 DIAGNOSIS — I255 Ischemic cardiomyopathy: Secondary | ICD-10-CM

## 2022-03-27 DIAGNOSIS — E7800 Pure hypercholesterolemia, unspecified: Secondary | ICD-10-CM | POA: Diagnosis not present

## 2022-03-27 DIAGNOSIS — E291 Testicular hypofunction: Secondary | ICD-10-CM

## 2022-03-27 NOTE — Progress Notes (Signed)
Primary Physician/Referring:  No primary care provider on file.  Patient ID: Jermaine Morris, male    DOB: 24-Aug-1960, 62 y.o.   MRN: [id number]  Chief Complaint  Patient presents with   Coronary Artery Disease   Cardiomyopathy   Follow-up    6 months   HPI:    Jermaine Morris  is a 62 y.o. Caucasian male patient with COPD, interstitial lung disease by CXR 02/16/2020, GERD,  tobacco use disorder who had quit smoking in September 2021 but has started smoking again,  hyperlipidemia, ischemic cardiomyopathy with severe LV systolic dysfunction of 15 to 82% in 2022, amd CAD with LAD stenting on 05/31/2020.  He is presently doing well and essentially asymptomatic.  He has started smoking again.  Dyspnea has remained stable.  Denies symptoms of TIA or claudication.  Past Medical History:  Diagnosis Date   COPD (chronic obstructive pulmonary disease) (HCC)    Coronary artery disease    Hyperlipidemia    Past Surgical History:  Procedure Laterality Date   CHOLECYSTECTOMY     CORONARY STENT INTERVENTION N/A 05/31/2020   Procedure: CORONARY STENT INTERVENTION;  Surgeon: Yates Decamp, MD;  Location: [facility] INVASIVE CV LAB;  Service: Cardiovascular;  Laterality: N/A;   LEFT HEART CATH N/A 05/31/2020   Procedure: Left Heart Cath;  Surgeon: Yates Decamp, MD;  Location: [facility] INVASIVE CV LAB;  Service: Cardiovascular;  Laterality: N/A;   RIGHT/LEFT HEART CATH AND CORONARY ANGIOGRAPHY N/A 05/29/2020   Procedure: RIGHT/LEFT HEART CATH AND CORONARY ANGIOGRAPHY;  Surgeon: Yates Decamp, MD;  Location: [facility] INVASIVE CV LAB;  Service: Cardiovascular;  Laterality: N/A;   Family History  Problem Relation Age of Onset   Cancer Father    Heart attack Brother     Social History   Tobacco Use   Smoking status: Every Day    Packs/day: 0.50    Years: 30.00    Total pack years: 15.00    Types: Cigarettes   Smokeless tobacco: Former    Types: Snuff   Tobacco comments:    Oct 2021, recently quit Chew also. Nicotine  pouches-enoucraged to quit.    Substance Use Topics   Alcohol use: Yes    Alcohol/week: 4.0 standard drinks of alcohol    Types: 4 Shots of liquor per week    Comment: occ. social on weekends.   Marital Status: Married  ROS  Review of Systems  Cardiovascular:  Positive for dyspnea on exertion. Negative for chest pain and leg swelling.  Gastrointestinal:  Negative for melena.   Objective  Blood pressure (!) 119/54, pulse 73, resp. rate 18, height 6' (1.829 m), weight 165 lb 9.6 oz (75.1 kg), SpO2 96 %.     03/27/2022    2:24 PM 09/19/2021    1:45 PM 03/22/2021    9:05 AM  Vitals with BMI  Height 6\' 0"  6\' 0"  6\' 0"   Weight 165 lbs 10 oz 166 lbs 3 oz 168 lbs  BMI 22.45 22.54 22.78  Systolic 119 110  Diastolic 54 70 62  Pulse 73 75 83    Physical Exam Neck:     Vascular: No carotid bruit or JVD.  Cardiovascular:     Rate and Rhythm: Normal rate and regular rhythm. Extrasystoles are present.    Pulses: Intact distal pulses.     Heart sounds: Normal heart sounds. No murmur heard.    No gallop.  Pulmonary:     Effort: Pulmonary effort is normal.     Breath sounds: Normal  breath sounds. No wheezing or rales.     Comments: Barrel shaped chest Abdominal:     General: Bowel sounds are normal.     Palpations: Abdomen is soft.  Musculoskeletal:     Right lower leg: No edema.     Left lower leg: No edema.    Laboratory examination:   Lab Results  Component Value Date   NA 139 03/22/2021   K 4.7 03/22/2021   CO2 25 03/22/2021   GLUCOSE 93 03/22/2021   BUN 14 03/22/2021   CREATININE 1.00 03/22/2021   CALCIUM 9.4 03/22/2021   EGFR 87 03/22/2021   GFRNONAA >60 06/01/2020       Latest Ref Rng & Units 03/22/2021   10:11 AM 06/01/2020    3:11 AM 05/31/2020    8:57 AM  CMP  Glucose 70 - 99 mg/dL 93  409  811   BUN 6 - 24 mg/dL 14  23  26    Creatinine 0.76 - 1.27 mg/dL  9.14  7.82   Sodium 134 - 144 mmol/L 139  131  132   Potassium 3.5 - 5.2 mmol/L 4.7  3.4  4.0    Chloride 96 - 106 mmol/L 99  97  96   CO2 20 - 29 mmol/L 25  26  25    Calcium 8.7 - 10.2 mg/dL 9.4  8.4  8.7   Total Protein 6.0 - 8.5 g/dL 6.5     Total Bilirubin 0.0 - 1.2 mg/dL 0.7     Alkaline Phos 44 - 121 IU/L 92     AST 0 - 40 IU/L 20     ALT 0 - 44 IU/L 20         Latest Ref Rng & Units 06/01/2020    3:11 AM 05/30/2020    3:37 AM 05/29/2020    7:29 PM  CBC  WBC 4.0 - 10.5 K/uL 12.4  10.9  10.9   Hemoglobin 13.0 - 17.0 g/dL 06/01/2020  05/31/2020  21.3   Hematocrit 39.0 - 52.0 % 47.6  46.9  46.3   Platelets 150 - 400 K/uL 292  254  274     Lipid Panel No results for input(s): "CHOL", "TRIG", "LDLCALC", "VLDL", "HDL", "CHOLHDL", "LDLDIRECT" in the last 8760 hours. Labs 04/05/2020:  Total cholesterol 206, triglycerides 74, HDL 59, LDL 146.  Non-HDL cholesterol 147. BNP    Component Value Date/Time   BNP 315.0 (H) 06/01/2020 0311   ProBNP    Component Value Date/Time   PROBNP 324 (H) 03/22/2021 1011    External labs:   04/05/2020: TSH normal.  Medications and allergies   Allergies  Allergen Reactions   Codeine Nausea And Vomiting   Current Outpatient Medications:    aspirin (ASPIRIN CHILDRENS) 81 MG chewable tablet, Chew 1 tablet (81 mg total) by mouth daily., Disp: , Rfl:    carvedilol (COREG) 12.5 MG tablet, Take 1 tablet (12.5 mg total) by mouth 2 (two) times daily with a meal. 2 tablets twice a day, Disp: 180 tablet, Rfl: 3   famotidine (PEPCID) 20 MG tablet, Take 20 mg by mouth 2 (two) times daily., Disp: , Rfl:    nitroGLYCERIN (NITROSTAT) 0.4 MG SL tablet, Place 1 tablet (0.4 mg total) under the tongue every 5 (five) minutes as needed for chest pain., Disp: 30 tablet, Rfl: 3   pyridOXINE (VITAMIN B-6) 50 MG tablet, TAKE 1 CAPSULE BY MOUTH DAILY AFTER BREAKFAST., Disp: 90 tablet, Rfl: 3   rosuvastatin (CRESTOR) 20 MG tablet,  TAKE 1 TABLET BY MOUTH EVERY DAY, Disp: 90 tablet, Rfl: 3   sacubitril-valsartan (ENTRESTO) 24-26 MG, Take 1 tablet by mouth 2 (two) times  daily., Disp: 180 tablet, Rfl: 1   spironolactone (ALDACTONE) 25 MG tablet, TAKE 1/2 TABLET BY MOUTH EVERY DAY, Disp: 45 tablet, Rfl: 4     Radiology:  Chest x-ray 05/30/2018: The heart size and mediastinal contours are unchanged. No focal consolidation. No pulmonary edema. No pleural effusion. No pneumothorax. No acute osseous abnormality.  Chest x-ray PA and lateral view 02/16/2020: Cardiovascular: Cardiac silhouette and pulmonary vasculature are within normal limits.  Mediastinum: Within normal limits.  Lungs/pleura: No pneumothorax or pleural effusion. Coarse interstitial and reticular opacities noted throughout both lungs. No consolidation or focal airspace opacities.  CT of the abdomen 2015: Aortic atherosclerosis, no aneurysm.  Cardiac Studies:     PCV MYOCARDIAL PERFUSION WITH LEXISCAN 05/02/2020 Lexiscan nuclear stress test performed using 1-day protocol. SPECT images show large LV cavity with global decrease in counts. In addition, there is medium sized, severe intensity, predominantly fixed perfusion defect in anterior apical myocardium. Global decrease in wall motion and thickening. Stress LVEF 13%. High risk study.  Right and left heart catheterization 05/29/20  RA 14/15, mean 11 mmHg. RV 61/7, EDP 14 mmHg. PA 63/27, mean 48 mmHg.  PA saturation 68%. PW 37/34, mean 32 mmHg.  Aortic saturation 98%. Cardiac output 4.28, cardiac index 2.13 by Fick.   LV: 134/24, EDP 37 mmHg.  There is no pressure gradient across the aortic valve. LM: Large vessel, has mild calcification. LAD: Proximal segment at septal perforator 1 has a 70 to 80% stenosis.  Following this there is 100% occlusion with ipsilateral and bridging collaterals to the LAD, short segment occlusion.  Large D1 and D2.  Diffuse mild coronary calcification is evident. CX: Large caliber vessel.  Gives origin to 2 large marginals.  Mild diffuse disease. RCA: Large-caliber vessel.  Mild disease is present.   Impression:  Patient is in acute decompensated heart failure with severely elevated LVEDP and pulmonary capillary wedge pressure.  Patient is also diaphoretic and has not been feeling well for the last few days.  He will need to be admitted to the hospital for diuresis and stabilization.  LAD is very short segment occlusion and appears to be amenable for revascularization, inview of of very young age, still worthwhile to proceed with angioplasty to see whether there will be remodeling of his LV with revascularization when stable hemodynamically.  30 mL contrast utilized.  Coronary angioplasty 05/31/2020: Successful stenting of the functionally occluded proximal LAD with implantation of a 3.5 x 18 mm resolute DES, due to edge dissection a 3.0 x 12 mm resolute Onyx DES was deployed.  Distal stent postdilated with the same stent balloon overlapping 2 stents at 16 atmospheric pressure and proximal stent postdilated with a 3.5 mm x 10 mm [place] at 16 atmospheric pressure. Stenosis reduced from 100% to 0%, TIMI I flow to TIMI III flow at the end of the procedure.  50 mL contrast utilized.   Recommendation: Upon presentation to the Cath Lab, as patient received sedation his blood pressure was very soft.  Hence patient received IV bolus of 250 mL as his LVEDP was 0 mmHg.  Closing pressure 80/60 mmHg.  Patient completely asymptomatic and in no distress.  Will hold a.m. dose of Lasix and parameters on blood pressure medications.   PCV ECHOCARDIOGRAM COMPLETE 03/21/2022  Narrative Echocardiogram 03/21/2022: Low normal LV systolic function with visual EF  50-55%. Left ventricle cavity is normal in size. Normal left ventricular wall thickness. Abnormal septal wall motion due to left bundle branch block. Normal diastolic filling pattern, normal LAP. No significant valvular heart disease. Compared to 03/14/2021 LVEF improved from 25-30% to 50-55%, G1DD is now normal, otherwise no significant change.   EKG:   EKG 03/27/2022:  Normal sinus rhythm with rate of 85 bpm, biatrial enlargement, normal axis.  Poor R progression, cannot exclude anteroseptal infarct old.  Compared to 09/19/2021, right atrial enlargement new.  Possible pulmonary disease pattern.  Assessment     ICD-10-CM   1. Coronary artery disease involving native coronary artery of native heart without angina pectoris  I25.10 COMPLETE METABOLIC PANEL WITH GFR    CBC    2. Ischemic cardiomyopathy  I25.5 EKG 12-Lead    3. PVC (premature ventricular contraction)  I49.3     4. Pulmonary fibrosis (HCC)  J84.10 Ambulatory referral to Pulmonology    5. Hypogonadism in male  E29.1 Testosterone,Free and Total    6. Hypercholesteremia  E78.00 Lipid Panel With LDL/HDL Ratio      Medications Discontinued During This Encounter  Medication Reason   Omega-3 Fatty Acids (FISH OIL) 1200 MG CAPS Patient Preference     No orders of the defined types were placed in this encounter.   Orders Placed This Encounter  Procedures   Testosterone,Free and Total   Lipid Panel With LDL/HDL Ratio   COMPLETE METABOLIC PANEL WITH GFR   CBC   Ambulatory referral to Pulmonology    Referral Priority:   Routine    Referral Type:   Consultation    Referral Reason:   Specialty Services Required    Requested Specialty:   Pulmonary Disease    Number of Visits Requested:   1   EKG 12-Lead   Recommendations:   Jermaine Morris is a 62 y.o. Caucasian male patient with COPD, interstitial lung disease by CXR 02/16/2020, GERD,  tobacco use disorder who had quit smoking in September 2021 but has started smoking again,  hyperlipidemia, ischemic cardiomyopathy with severe LV systolic dysfunction of 15 to 25% in 2022, amd CAD with LAD stenting on 05/31/2020.  1. Coronary artery disease involving native coronary artery of native heart without angina pectoris Patient has ischemic cardiomyopathy with severe LV systolic dysfunction of 15 to 05% in 2022 and has had LAD stenting in March 2022,  now repeat echocardiogram shows ejection fraction of 50 to 55% improved from 25 to 30% in January 2023 which is a big surprise but pleasant.  I discussed this with the patient.  There is no clinical evidence of heart failure.  He has not had any angina pectoris.  Dyspnea has remained stable.  I am concerned about his restarting smoking, EKG reveals no changes of right atrial enlargement.  2. Ischemic cardiomyopathy Ischemic cardiomyopathy has improved on medical therapy, he is on minimal dose of Entresto due to low blood pressure and also on maximally tolerated carvedilol at 12.5 mg twice daily.  LVEF surprisingly has normalized.  3. PVC (premature ventricular contraction) EKG today does not reveal any evidence of PVCs, I also suspect a component of nonischemic cardiomyopathy related to frequent PVCs and PVC burden.  Hence on high-dose beta-blocker therapy and spite of COPD.  4. Pulmonary fibrosis (HCC) Patient has known pulmonary fibrosis, but does not have any pulmonary evaluation.  I will make a referral for IPF evaluation.  5. Hypogonadism in male Patient complains of fatigue, lack of sexual interest and erectile  dysfunction.  He would like to check his testosterone levels.  Labs ordered.  6. Hypercholesteremia I will also obtain lipid profile testing and make further recommendation when I see him back in 6 weeks.  I will try to set him up with the PCP on his next office visit.  He used to be close to [place], but has now relocated to [facility].  He still comes to [place] on a daily basis for his work.   Adrian Prows, MD, [facility] 03/27/2022, 6:09 PM Office: [phone] Fax: [phone] Pager: [phone]

## 2022-03-28 DIAGNOSIS — I2510 Atherosclerotic heart disease of native coronary artery without angina pectoris: Secondary | ICD-10-CM | POA: Diagnosis not present

## 2022-03-28 DIAGNOSIS — E291 Testicular hypofunction: Secondary | ICD-10-CM | POA: Diagnosis not present

## 2022-03-28 DIAGNOSIS — E7800 Pure hypercholesterolemia, unspecified: Secondary | ICD-10-CM | POA: Diagnosis not present

## 2022-03-29 LAB — CMP14+EGFR
ALT: 21 IU/L (ref 0–44)
AST: 24 IU/L (ref 0–40)
Albumin/Globulin Ratio: 2.5 — ABNORMAL HIGH (ref 1.2–2.2)
Albumin: 4.7 g/dL (ref 3.8–4.9)
Alkaline Phosphatase: 89 IU/L (ref 44–121)
BUN/Creatinine Ratio: 11 (ref 10–24)
BUN: 11 mg/dL (ref 8–27)
Bilirubin Total: 0.8 mg/dL (ref 0.0–1.2)
CO2: 26 mmol/L (ref 20–29)
Calcium: 9.4 mg/dL (ref 8.6–10.2)
Chloride: 98 mmol/L (ref 96–106)
Creatinine, Ser: 0.97 mg/dL (ref 0.76–1.27)
Globulin, Total: 1.9 g/dL (ref 1.5–4.5)
Glucose: 129 mg/dL — ABNORMAL HIGH (ref 70–99)
Potassium: 4.6 mmol/L (ref 3.5–5.2)
Sodium: 139 mmol/L (ref 134–144)
Total Protein: 6.6 g/dL (ref 6.0–8.5)
eGFR: 89 mL/min/{1.73_m2} (ref >59)

## 2022-03-29 LAB — CBC
Hematocrit: 47.2 % (ref 37.5–51.0)
Hemoglobin: 16.1 g/dL (ref 13.0–17.7)
MCH: 34.9 pg — ABNORMAL HIGH (ref 26.6–33.0)
MCHC: 34.1 g/dL (ref 31.5–35.7)
MCV: 102 fL — ABNORMAL HIGH (ref 79–97)
Platelets: 247 10*3/uL (ref 150–450)
RBC: 4.61 x10E6/uL (ref 4.14–5.80)
RDW: 12.3 % (ref 11.6–15.4)
WBC: 6.9 10*3/uL (ref 3.4–10.8)

## 2022-03-29 NOTE — Progress Notes (Signed)
Please call [facility], I had also ordered lipid profile and testosterone testing to be done.

## 2022-04-07 LAB — LIPID PANEL WITH LDL/HDL RATIO
Cholesterol, Total: 142 mg/dL (ref 100–199)
HDL: 68 mg/dL (ref >39)
LDL Chol Calc (NIH): 58 mg/dL (ref 0–99)
LDL/HDL Ratio: 0.9 ratio (ref 0.0–3.6)
Triglycerides: 84 mg/dL (ref 0–149)
VLDL Cholesterol Cal: 16 mg/dL (ref 5–40)

## 2022-04-07 LAB — TESTOSTERONE,FREE AND TOTAL
Testosterone, Free: 10.1 pg/mL (ref 6.6–18.1)
Testosterone: 715 ng/dL (ref 264–916)

## 2022-04-07 NOTE — Progress Notes (Signed)
Results given, patient had no further questions.

## 2022-04-07 NOTE — Progress Notes (Signed)
Testosterone and cholesterol normal  Testosterone 03/28/2022: 264 - 916 ng/dL 715

## 2022-05-08 ENCOUNTER — Ambulatory Visit: Payer: BC Managed Care – PPO | Admitting: Cardiology

## 2022-05-16 ENCOUNTER — Ambulatory Visit: Payer: BC Managed Care – PPO | Admitting: Cardiology

## 2022-06-13 ENCOUNTER — Other Ambulatory Visit: Payer: Self-pay | Admitting: Cardiology

## 2022-06-13 DIAGNOSIS — I5022 Chronic systolic (congestive) heart failure: Secondary | ICD-10-CM

## 2022-06-18 ENCOUNTER — Telehealth: Payer: Self-pay

## 2022-06-18 NOTE — Telephone Encounter (Signed)
Patient calling because he has been off of all of his medication for a couple weeks (he ran out and didn't call for refills). His blood pressure shot up on Friday 160/102, has come down since and has been around 130/80 since then (he started his medications again). He is now concerned about stroke or blood clots. He did miss his last appointment so I will get him scheduled for a follow up.

## 2022-06-19 ENCOUNTER — Ambulatory Visit: Payer: BC Managed Care – PPO | Admitting: Cardiology

## 2022-06-19 ENCOUNTER — Encounter: Payer: Self-pay | Admitting: Cardiology

## 2022-06-19 VITALS — BP 133/79 | HR 71 | Resp 16 | Ht 72.0 in | Wt 156.0 lb

## 2022-06-19 DIAGNOSIS — I2510 Atherosclerotic heart disease of native coronary artery without angina pectoris: Secondary | ICD-10-CM

## 2022-06-19 DIAGNOSIS — I255 Ischemic cardiomyopathy: Secondary | ICD-10-CM

## 2022-06-19 DIAGNOSIS — I10 Essential (primary) hypertension: Secondary | ICD-10-CM | POA: Diagnosis not present

## 2022-06-19 DIAGNOSIS — E7800 Pure hypercholesterolemia, unspecified: Secondary | ICD-10-CM

## 2022-06-19 NOTE — Progress Notes (Signed)
Primary Physician/Referring:  Patient, No Pcp Per  Patient ID: Jermaine Morris, male    DOB: 1960-11-11, 62 y.o.   MRN: [id number]  Chief Complaint  Patient presents with   Coronary artery disease involving native coronary artery of   Follow-up   HPI:    Jermaine Morris  is a 62 y.o. 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  Patient had discontinued all his medications, states that he was under extreme stress, started to smoke again.  He felt extremely poorly, with shortness of breath, not feeling well, fear of stroke and restarted all his medications back.  He quit smoking again 2 weeks ago Apr 2024. Dyspnea has remained stable.  Denies symptoms of TIA or claudication.  He now presents for follow-up.  States that he is feeling like he is back to normal.  He is very shaken up by the whole episode.  Past Medical History:  Diagnosis Date   COPD (chronic obstructive pulmonary disease)    Coronary artery disease    Hyperlipidemia    Past Surgical History:  Procedure Laterality Date   CHOLECYSTECTOMY     CORONARY STENT INTERVENTION N/A 05/31/2020   Procedure: CORONARY STENT INTERVENTION;  Surgeon: Yates Decamp, MD;  Location: [facility];  Service: Cardiovascular;  Laterality: N/A;   LEFT HEART CATH N/A 05/31/2020   Procedure: Left Heart Cath;  Surgeon: Yates Decamp, MD;  Location: [facility];  Service: Cardiovascular;  Laterality: N/A;   RIGHT/LEFT HEART CATH AND CORONARY ANGIOGRAPHY N/A 05/29/2020   Procedure: RIGHT/LEFT HEART CATH AND CORONARY ANGIOGRAPHY;  Surgeon: Yates Decamp, MD;  Location: [facility];  Service:  Cardiovascular;  Laterality: N/A;   Family History  Problem Relation Age of Onset   Cancer Father    Heart attack Brother     Social History   Tobacco Use   Smoking status: Former    Packs/day: 0.50    Years: 30.00    Additional pack years: 0.00    Total pack years: 15.00    Types: Cigarettes    Quit date: 05/09/2022    Years since quitting: 0.1   Smokeless tobacco: Former    Types: Snuff   Tobacco comments:    Oct 2021, recently quit Chew also. Nicotine pouches-enoucraged to quit.    Substance Use Topics   Alcohol use: Yes    Alcohol/week: 4.0 standard drinks of alcohol    Types: 4 Shots of liquor per week    Comment: occ. social on weekends.   Marital Status: Married  ROS  Review of Systems  Cardiovascular:  Positive for dyspnea on exertion. Negative for chest pain and leg swelling.  Gastrointestinal:  Negative for melena.   Objective  Blood pressure 133/79, pulse 71, resp. rate 16, height 6' (1.829 m), weight 156 lb (70.8 kg), SpO2 98 %.     06/19/2022    9:09 AM 03/27/2022    2:24 PM 09/19/2021    1:45 PM  Vitals with BMI  Height 6\' 0"  6\' 0"  6\' 0"   Weight 156 lbs 165 lbs 10 oz 166 lbs 3 oz  BMI 21.15 22.45 22.54  Systolic 133  119 110  Diastolic 79 54 70  Pulse 71 73 75    Physical Exam Neck:     Vascular: No carotid bruit or JVD.  Cardiovascular:     Rate and Rhythm: Normal rate and regular rhythm. No extrasystoles are present.    Pulses: Intact distal pulses.     Heart sounds: Normal heart sounds. No murmur heard.    No gallop.  Pulmonary:     Effort: Pulmonary effort is normal.     Breath sounds: Normal breath sounds. No wheezing or rales.     Comments: Barrel shaped chest Abdominal:     General: Bowel sounds are normal.     Palpations: Abdomen is soft.  Musculoskeletal:     Right lower leg: No edema.     Left lower leg: No edema.    Laboratory examination:   Lab Results  Component Value Date   NA 139 03/28/2022   K 4.6 03/28/2022   CO2 26  03/28/2022   GLUCOSE 129 (H) 03/28/2022   BUN 11 03/28/2022   CREATININE 0.97 03/28/2022   CALCIUM 9.4 03/28/2022   EGFR 89 03/28/2022   GFRNONAA >60 06/01/2020       Latest Ref Rng & Units 03/28/2022   10:45 AM 03/22/2021   10:11 AM 06/01/2020    3:11 AM  CMP  Glucose 70 - 99 mg/dL 518  93  841   BUN 8 - 27 mg/dL 11  14  23    Creatinine 0.76 - 1.27 mg/dL 6.60  6.30  1.60   Sodium 134 - 144 mmol/L 139  139  131   Potassium 3.5 - 5.2 mmol/L 4.6  4.7  3.4   Chloride 96 - 106 mmol/L 98  99  97   CO2 20 - 29 mmol/L 26  25  26    Calcium 8.6 - 10.2 mg/dL 9.4  9.4  8.4   Total Protein 6.0 - 8.5 g/dL 6.6  6.5    Total Bilirubin 0.0 - 1.2 mg/dL 0.8  0.7    Alkaline Phos 44 - 121 IU/L 89  92    AST 0 - 40 IU/L 24  20    ALT 0 - 44 IU/L 21  20        Latest Ref Rng & Units 03/28/2022   10:45 AM 06/01/2020    3:11 AM 05/30/2020    3:37 AM  CBC  WBC 3.4 - 10.8 x10E3/uL 6.9  12.4  10.9   Hemoglobin 13.0 - 17.7 g/dL 10.9  32.3  55.7   Hematocrit 37.5 - 51.0 % 47.2  47.6  46.9   Platelets 150 - 450 x10E3/uL 247  292  254     Lipid Panel Recent Labs    03/28/22 1046  CHOL 142  TRIG 84  LDLCALC 58  HDL 68   Labs 04/05/2020:  Total cholesterol 206, triglycerides 74, HDL 59, LDL 146.  Non-HDL cholesterol 147. BNP    Component Value Date/Time   BNP 315.0 (H) 06/01/2020 0311   ProBNP    Component Value Date/Time   PROBNP 324 (H) 03/22/2021 1011    External labs:   04/05/2020: TSH normal.  Radiology:  Chest x-ray 05/30/2018: The heart size and mediastinal contours are unchanged. No focal consolidation. No pulmonary edema. No pleural effusion. No pneumothorax. No acute osseous abnormality.  Chest x-ray PA and lateral view 02/16/2020: Cardiovascular: Cardiac silhouette and pulmonary vasculature are within normal limits.  Mediastinum: Within normal limits.  Lungs/pleura: No pneumothorax or pleural  effusion. Coarse interstitial and reticular opacities noted throughout both  lungs. No consolidation or focal airspace opacities.  CT of the abdomen 2015: Aortic atherosclerosis, no aneurysm.  Cardiac Studies:     PCV MYOCARDIAL PERFUSION WITH LEXISCAN 05/02/2020 Lexiscan nuclear stress test performed using 1-day protocol. SPECT images show large LV cavity with global decrease in counts. In addition, there is medium sized, severe intensity, predominantly fixed perfusion defect in anterior apical myocardium. Global decrease in wall motion and thickening. Stress LVEF 13%. High risk study.  Right and left heart catheterization 05/29/20  RA 14/15, mean 11 mmHg. RV 61/7, EDP 14 mmHg. PA 63/27, mean 48 mmHg.  PA saturation 68%. PW 37/34, mean 32 mmHg.  Aortic saturation 98%. Cardiac output 4.28, cardiac index 2.13 by Fick.   LV: 134/24, EDP 37 mmHg.  There is no pressure gradient across the aortic valve. LM: Large vessel, has mild calcification. LAD: Proximal segment at septal perforator 1 has a 70 to 80% stenosis.  Following this there is 100% occlusion with ipsilateral and bridging collaterals to the LAD, short segment occlusion.  Large D1 and D2.  Diffuse mild coronary calcification is evident. CX: Large caliber vessel.  Gives origin to 2 large marginals.  Mild diffuse disease. RCA: Large-caliber vessel.  Mild disease is present.   Impression: Patient is in acute decompensated heart failure with severely elevated LVEDP and pulmonary capillary wedge pressure.  Patient is also diaphoretic and has not been feeling well for the last few days.  He will need to be admitted to the hospital for diuresis and stabilization.  LAD is very short segment occlusion and appears to be amenable for revascularization, inview of of very young age, still worthwhile to proceed with angioplasty to see whether there will be remodeling of his LV with revascularization when stable hemodynamically.  30 mL contrast utilized.  Coronary angioplasty 05/31/2020: Successful stenting of the functionally  occluded proximal LAD with implantation of a 3.5 x 18 mm resolute DES, due to edge dissection a 3.0 x 12 mm resolute Onyx DES was deployed.  Distal stent postdilated with the same stent balloon overlapping 2 stents at 16 atmospheric pressure and proximal stent postdilated with a 3.5 mm x 10 mm Sapphire [place] at 16 atmospheric pressure. Stenosis reduced from 100% to 0%, TIMI I flow to TIMI III flow at the end of the procedure.  50 mL contrast utilized.   Recommendation: Upon presentation to the Cath Lab, as patient received sedation his blood pressure was very soft.  Hence patient received IV bolus of 250 mL as his LVEDP was 0 mmHg.  Closing pressure 80/60 mmHg.  Patient completely asymptomatic and in no distress.  Will hold a.m. dose of Lasix and parameters on blood pressure medications.   PCV ECHOCARDIOGRAM COMPLETE 03/21/2022  Narrative Echocardiogram 03/21/2022: Low normal LV systolic function with visual EF 50-55%. Left ventricle cavity is normal in size. Normal left ventricular wall thickness. Abnormal septal wall motion due to left bundle branch block. Normal diastolic filling pattern, normal LAP. No significant valvular heart disease. Compared to 03/14/2021 LVEF improved from 25-30% to 50-55%, G1DD is now normal, otherwise no significant change.   EKG:   EKG 03/27/2022: Normal sinus rhythm with rate of 85 bpm, biatrial enlargement, normal axis.  Poor R progression, cannot exclude anteroseptal infarct old.  Compared to 09/19/2021, right atrial enlargement new.  Possible pulmonary disease pattern.   Medications and allergies    Allergies  Allergen Reactions   Codeine Nausea And Vomiting  Current Outpatient Medications:    aspirin (ASPIRIN CHILDRENS) 81 MG chewable tablet, Chew 1 tablet (81 mg total) by mouth daily., Disp: , Rfl:    carvedilol (COREG) 12.5 MG tablet, Take 1 tablet (12.5 mg total) by mouth 2 (two) times daily with a meal. 2 tablets twice a day, Disp: 180 tablet, Rfl: 3    ENTRESTO 24-26 MG, TAKE 1 TABLET BY MOUTH TWICE A DAY, Disp: 60 tablet, Rfl: 1   famotidine (PEPCID) 20 MG tablet, Take 20 mg by mouth 2 (two) times daily., Disp: , Rfl:    nitroGLYCERIN (NITROSTAT) 0.4 MG SL tablet, Place 1 tablet (0.4 mg total) under the tongue every 5 (five) minutes as needed for chest pain., Disp: 30 tablet, Rfl: 3   pyridOXINE (VITAMIN B-6) 50 MG tablet, TAKE 1 CAPSULE BY MOUTH DAILY AFTER BREAKFAST., Disp: 90 tablet, Rfl: 3   rosuvastatin (CRESTOR) 20 MG tablet, TAKE 1 TABLET BY MOUTH EVERY DAY, Disp: 90 tablet, Rfl: 3   spironolactone (ALDACTONE) 25 MG tablet, TAKE 1/2 TABLET BY MOUTH EVERY DAY, Disp: 45 tablet, Rfl: 4    Assessment     ICD-10-CM   1. Coronary artery disease involving native coronary artery of native heart without angina pectoris  I25.10     2. Ischemic cardiomyopathy  I25.5     3. Hypercholesteremia  E78.00     4. Primary hypertension  I10       There are no discontinued medications.    No orders of the defined types were placed in this encounter.   No orders of the defined types were placed in this encounter.  Recommendations:   Jermaine Morris is a 62 y.o. 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  Patient had stopped taking his medications and missed appointment and felt extremely poorly, now very nervous whether he has heard his heart.  He is back on all his medications and started to feel "normal again".  1. Coronary artery disease involving native coronary artery of native heart without angina pectoris Patient fortunately is doing well, back on all his medications.  States that he has  no one to smoke again, he had remained abstinent for almost a year or 2 after CAD and CHF diagnosis.  I reinforced abstinence.  2. Ischemic cardiomyopathy Patient has combination of ischemic and nonischemic cardiomyopathy probably related to frequent PVCs, on high dose beta-blocker, PVCs have subsided, he is not having any palpitations.  LVEF has improved to normal.  He is also on guideline directed medical therapy with no clinical evidence of heart failure.  3. Hypercholesteremia I checked his lipids on his last office visit, lipids under excellent control, continue present statin dose.  4. Primary hypertension Blood pressure is well-controlled, continue present medications, like to see him back in 3 months to improve compliance.    Yates DecampJay Ji Fairburn, MD, [facility] CampusFACC 06/19/2022, 9:55 AM Office: [phone] Fax: [phone] Pager: [phone]

## 2022-08-29 ENCOUNTER — Other Ambulatory Visit: Payer: Self-pay

## 2022-08-29 DIAGNOSIS — I5022 Chronic systolic (congestive) heart failure: Secondary | ICD-10-CM

## 2022-08-29 MED ORDER — ENTRESTO 24-26 MG PO TABS
1.0000 | ORAL_TABLET | Freq: Two times a day (BID) | ORAL | 1 refills | Status: DC
Start: 2022-08-29 — End: 2022-11-03

## 2022-09-18 ENCOUNTER — Ambulatory Visit: Payer: BC Managed Care – PPO | Admitting: Cardiology

## 2022-09-19 ENCOUNTER — Other Ambulatory Visit: Payer: Self-pay | Admitting: Cardiology

## 2022-09-19 ENCOUNTER — Ambulatory Visit: Payer: BC Managed Care – PPO | Admitting: Cardiology

## 2022-09-19 DIAGNOSIS — I493 Ventricular premature depolarization: Secondary | ICD-10-CM

## 2022-09-23 ENCOUNTER — Encounter: Payer: Self-pay | Admitting: Cardiology

## 2022-09-23 ENCOUNTER — Ambulatory Visit: Payer: BC Managed Care – PPO | Admitting: Cardiology

## 2022-09-23 VITALS — BP 116/67 | HR 62 | Resp 16 | Ht 72.0 in | Wt 161.0 lb

## 2022-09-23 DIAGNOSIS — I2510 Atherosclerotic heart disease of native coronary artery without angina pectoris: Secondary | ICD-10-CM | POA: Diagnosis not present

## 2022-09-23 DIAGNOSIS — I493 Ventricular premature depolarization: Secondary | ICD-10-CM | POA: Diagnosis not present

## 2022-09-23 DIAGNOSIS — I10 Essential (primary) hypertension: Secondary | ICD-10-CM

## 2022-09-23 NOTE — Progress Notes (Signed)
Primary Physician/Referring:  Patient, No Pcp Per  Patient ID: Jermaine Morris, male    DOB: 1960-04-26, 62 y.o.   MRN: [id number]  Chief Complaint  Patient presents with   Hypertension   Follow-up   HPI:    Jermaine Morris  is a 62 y.o. Caucasian male patient with COPD, interstitial lung disease by CXR 02/16/2020, GERD,  tobacco use disorder who had quit smoking in September 2021 but has started smoking again,  hyperlipidemia, ischemic cardiomyopathy with severe LV systolic dysfunction of 15 to 82% in 2022 now improved to low normal by echo in Jan 2024, frequent PVCs on high dose beta blockers, CAD with LAD stenting on 05/31/2020.  Patient had stopped taking his medications and missed appointment and felt extremely poorly, now very nervous whether he has heard his heart.  He is back on all his medications and started to feel "normal again".  He also started smoking.  This is a 61-month office visit to improve compliance.  He quit smoking again 2 months ago and has remained abstinent. Dyspnea has remained stable.  Denies symptoms of TIA or claudication. States that he is feeling like he is back to normal.  He has been taking all his medications as prescribed. Past Medical History:  Diagnosis Date   COPD (chronic obstructive pulmonary disease) (HCC)    Coronary artery disease    Hyperlipidemia    Past Surgical History:  Procedure Laterality Date   CHOLECYSTECTOMY     CORONARY STENT INTERVENTION N/A 05/31/2020   Procedure: CORONARY STENT INTERVENTION;  Surgeon: Yates Decamp, MD;  Location: [facility] INVASIVE CV LAB;  Service: Cardiovascular;  Laterality: N/A;   LEFT HEART CATH N/A 05/31/2020   Procedure: Left Heart Cath;  Surgeon: Yates Decamp, MD;  Location: [facility] INVASIVE CV LAB;  Service: Cardiovascular;  Laterality: N/A;   RIGHT/LEFT HEART CATH AND CORONARY ANGIOGRAPHY N/A 05/29/2020   Procedure: RIGHT/LEFT HEART CATH AND CORONARY ANGIOGRAPHY;  Surgeon: Yates Decamp, MD;  Location: [facility] INVASIVE CV LAB;   Service: Cardiovascular;  Laterality: N/A;   Family History  Problem Relation Age of Onset   Cancer Father    Heart attack Brother     Social History   Tobacco Use   Smoking status: Former    Current packs/day: 0.00    Average packs/day: 0.5 packs/day for 30.0 years (15.0 ttl pk-yrs)    Types: Cigarettes    Start date: 05/08/1992    Quit date: 05/09/2022    Years since quitting: 61   Smokeless tobacco: Former    Types: Snuff   Tobacco comments:    Oct 2021, recently quit Chew also. Nicotine pouches-enoucraged to quit.    Substance Use Topics   Alcohol use: Yes    Alcohol/week: 4.0 standard drinks of alcohol    Types: 4 Shots of liquor per week    Comment: occ. social on weekends.   Marital Status: Married  ROS  Review of Systems  Cardiovascular:  Positive for dyspnea on exertion. Negative for chest pain and leg swelling.  Gastrointestinal:  Negative for melena.   Objective  Blood pressure 116/67, pulse 62, resp. rate 16, height 6' (1.829 m), weight 161 lb (73 kg), SpO2 96%.     09/23/2022    2:03 PM 06/19/2022    9:09 AM 03/27/2022    2:24 PM  Vitals with BMI  Height 6\' 0"  6\' 0"  6\' 0"   Weight 161 lbs 156 lbs 165 lbs 10 oz  BMI 21.83 21.15 22.45  Systolic 116 133  119  Diastolic 67 79 54  Pulse 62 71 73    Physical Exam Neck:     Vascular: No carotid bruit or JVD.  Cardiovascular:     Rate and Rhythm: Normal rate and regular rhythm. No extrasystoles are present.    Pulses: Intact distal pulses.     Heart sounds: Normal heart sounds. No murmur heard.    No gallop.  Pulmonary:     Effort: Pulmonary effort is normal.     Breath sounds: Normal breath sounds. No wheezing or rales.     Comments: Barrel shaped chest Abdominal:     General: Bowel sounds are normal.     Palpations: Abdomen is soft.  Musculoskeletal:     Right lower leg: No edema.     Left lower leg: No edema.    Laboratory examination:   Lab Results  Component Value Date   NA 139 03/28/2022   K  4.6 03/28/2022   CO2 26 03/28/2022   GLUCOSE 129 (H) 03/28/2022   BUN 11 03/28/2022   CREATININE 0.97 03/28/2022   CALCIUM 9.4 03/28/2022   EGFR 89 03/28/2022   GFRNONAA >60 06/01/2020       Latest Ref Rng & Units 03/28/2022   10:45 AM 03/22/2021   10:11 AM 06/01/2020    3:11 AM  CMP  Glucose 70 - 99 mg/dL 295  93  621   BUN 8 - 27 mg/dL 11  14  23    Creatinine 0.76 - 1.27 mg/dL 3.08  6.57  8.46   Sodium 134 - 144 mmol/L 139  139  131   Potassium 3.5 - 5.2 mmol/L 4.6  4.7  3.4   Chloride 96 - 106 mmol/L 98  99  97   CO2 20 - 29 mmol/L 26  25  26    Calcium 8.6 - 10.2 mg/dL 9.4  9.4  8.4   Total Protein 6.0 - 8.5 g/dL 6.6  6.5    Total Bilirubin 0.0 - 1.2 mg/dL 0.8  0.7    Alkaline Phos 44 - 121 IU/L 89  92    AST 0 - 40 IU/L 24  20    ALT 0 - 44 IU/L 21  20        Latest Ref Rng & Units 03/28/2022   10:45 AM 06/01/2020    3:11 AM 05/30/2020    3:37 AM  CBC  WBC 3.4 - 10.8 x10E3/uL 6.9  12.4  10.9   Hemoglobin 13.0 - 17.7 g/dL 96.2  95.2  84.1   Hematocrit 37.5 - 51.0 % 47.2  47.6  46.9   Platelets 150 - 450 x10E3/uL 247  292  254     Lipid Panel Recent Labs    03/28/22 1046  CHOL 142  TRIG 84  LDLCALC 58  HDL 68   Labs 04/05/2020:  Total cholesterol 206, triglycerides 74, HDL 59, LDL 146.  Non-HDL cholesterol 147. BNP    Component Value Date/Time   BNP 315.0 (H) 06/01/2020 0311   ProBNP    Component Value Date/Time   PROBNP 324 (H) 03/22/2021 1011    External labs:   04/05/2020: TSH normal.  Radiology:  Chest x-ray 05/30/2018: The heart size and mediastinal contours are unchanged. No focal consolidation. No pulmonary edema. No pleural effusion. No pneumothorax. No acute osseous abnormality.  Chest x-ray PA and lateral view 02/16/2020: Cardiovascular: Cardiac silhouette and pulmonary vasculature are within normal limits.  Mediastinum: Within normal limits.  Lungs/pleura: No pneumothorax or pleural effusion.  Coarse interstitial and reticular opacities  noted throughout both lungs. No consolidation or focal airspace opacities.  CT of the abdomen 2015: Aortic atherosclerosis, no aneurysm.  Cardiac Studies:     PCV MYOCARDIAL PERFUSION WITH LEXISCAN 05/02/2020 Lexiscan nuclear stress test performed using 1-day protocol. SPECT images show large LV cavity with global decrease in counts. In addition, there is medium sized, severe intensity, predominantly fixed perfusion defect in anterior apical myocardium. Global decrease in wall motion and thickening. Stress LVEF 13%. High risk study.  Right and left heart catheterization 05/29/20  RA 14/15, mean 11 mmHg. RV 61/7, EDP 14 mmHg. PA 63/27, mean 48 mmHg.  PA saturation 68%. PW 37/34, mean 32 mmHg.  Aortic saturation 98%. Cardiac output 4.28, cardiac index 2.13 by Fick.   LV: 134/24, EDP 37 mmHg.  There is no pressure gradient across the aortic valve. LM: Large vessel, has mild calcification. LAD: Proximal segment at septal perforator 1 has a 70 to 80% stenosis.  Following this there is 100% occlusion with ipsilateral and bridging collaterals to the LAD, short segment occlusion.  Large D1 and D2.  Diffuse mild coronary calcification is evident. CX: Large caliber vessel.  Gives origin to 2 large marginals.  Mild diffuse disease. RCA: Large-caliber vessel.  Mild disease is present.   Impression: Patient is in acute decompensated heart failure with severely elevated LVEDP and pulmonary capillary wedge pressure.  Patient is also diaphoretic and has not been feeling well for the last few days.  He will need to be admitted to the hospital for diuresis and stabilization.  LAD is very short segment occlusion and appears to be amenable for revascularization, inview of of very young age, still worthwhile to proceed with angioplasty to see whether there will be remodeling of his LV with revascularization when stable hemodynamically.  30 mL contrast utilized.  Coronary angioplasty 05/31/2020: Successful  stenting of the functionally occluded proximal LAD with implantation of a 3.5 x 18 mm resolute DES, due to edge dissection a 3.0 x 12 mm resolute Onyx DES was deployed.  Distal stent postdilated with the same stent balloon overlapping 2 stents at 16 atmospheric pressure and proximal stent postdilated with a 3.5 mm x 10 mm [place] at 16 atmospheric pressure. Stenosis reduced from 100% to 0%, TIMI I flow to TIMI III flow at the end of the procedure.  50 mL contrast utilized.   Recommendation: Upon presentation to the Cath Lab, as patient received sedation his blood pressure was very soft.  Hence patient received IV bolus of 250 mL as his LVEDP was 0 mmHg.  Closing pressure 80/60 mmHg.  Patient completely asymptomatic and in no distress.  Will hold a.m. dose of Lasix and parameters on blood pressure medications.   PCV ECHOCARDIOGRAM COMPLETE 03/21/2022  Narrative Echocardiogram 03/21/2022: Low normal LV systolic function with visual EF 50-55%. Left ventricle cavity is normal in size. Normal left ventricular wall thickness. Abnormal septal wall motion due to left bundle branch block. Normal diastolic filling pattern, normal LAP. No significant valvular heart disease. Compared to 03/14/2021 LVEF improved from 25-30% to 50-55%, G1DD is now normal, otherwise no significant change.   EKG:   EKG 03/27/2022: Normal sinus rhythm with rate of 85 bpm, biatrial enlargement, normal axis.  Poor R progression, cannot exclude anteroseptal infarct old.  Compared to 09/19/2021, right atrial enlargement new.  Possible pulmonary disease pattern.   Medications and allergies    Allergies  Allergen Reactions   Codeine Nausea And Vomiting  Current Outpatient Medications:    aspirin (ASPIRIN CHILDRENS) 81 MG chewable tablet, Chew 1 tablet (81 mg total) by mouth daily., Disp: , Rfl:    carvedilol (COREG) 12.5 MG tablet, Take 1 tablet (12.5 mg total) by mouth 2 (two) times daily with a meal. 2 tablets twice a day,  Disp: 180 tablet, Rfl: 3   famotidine (PEPCID) 20 MG tablet, Take 20 mg by mouth 2 (two) times daily., Disp: , Rfl:    pyridOXINE (VITAMIN B6) 50 MG tablet, TAKE 1 TABLET BY MOUTH DAILY AFTER BREAKFAST, Disp: 100 tablet, Rfl: 2   rosuvastatin (CRESTOR) 20 MG tablet, TAKE 1 TABLET BY MOUTH EVERY DAY, Disp: 90 tablet, Rfl: 3   sacubitril-valsartan (ENTRESTO) 24-26 MG, Take 1 tablet by mouth 2 (two) times daily., Disp: 60 tablet, Rfl: 1   spironolactone (ALDACTONE) 25 MG tablet, TAKE 1/2 TABLET BY MOUTH EVERY DAY, Disp: 45 tablet, Rfl: 4   nitroGLYCERIN (NITROSTAT) 0.4 MG SL tablet, Place 1 tablet (0.4 mg total) under the tongue every 5 (five) minutes as needed for chest pain., Disp: 30 tablet, Rfl: 3    Assessment     ICD-10-CM   1. Coronary artery disease involving native coronary artery of native heart without angina pectoris  I25.10     2. Primary hypertension  I10       There are no discontinued medications.    No orders of the defined types were placed in this encounter.   No orders of the defined types were placed in this encounter.  Recommendations:   Mace Weinberg is a 62 y.o. 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  Patient had stopped taking his medications and missed appointment and felt extremely poorly, now very nervous whether he has heard his heart.  He is back on all his medications and started to feel "normal again".  He also started smoking.  This is a 26-month office visit to improve compliance.  1. Coronary artery disease involving native coronary artery of native heart without angina pectoris Patient has completely  quit smoking 2 months ago and has remained abstinent and thinks that he will never pick up smoking again.  I am seeing him back to improve compliance.  He is now back on all his cardiac medications and has not missed any doses either.  States that he is feeling the best he has in quite a while and remains asymptomatic.  - PCV ECHOCARDIOGRAM COMPLETE; Future  2. Primary hypertension Blood pressure is under excellent control.  He also has had lipid profile testing which is under excellent control.  He remains asymptomatic.  Suspect his LV systolic dysfunction was a combination of coronary artery disease and also frequent PVCs leading to her poor LVEF which is now improved on medical therapy to normal.  I would like to repeat echocardiogram in 6 months and see him back at that time.  3. PVC (premature ventricular contraction) On auscultation he does have PVCs that are fairly frequent.  But remains asymptomatic.  Continue present dose beta-blocker therapy.  Echocardiogram has been ordered for follow-up. - PCV ECHOCARDIOGRAM COMPLETE; Future      Yates Decamp, MD, [facility]  09/23/2022, 2:06 PM Office: [phone] Fax: [phone] Pager: [phone]

## 2022-10-12 ENCOUNTER — Other Ambulatory Visit: Payer: Self-pay | Admitting: Cardiology

## 2022-10-22 NOTE — Telephone Encounter (Signed)
No action done

## 2022-11-01 ENCOUNTER — Other Ambulatory Visit: Payer: Self-pay | Admitting: Cardiology

## 2022-11-01 DIAGNOSIS — I5022 Chronic systolic (congestive) heart failure: Secondary | ICD-10-CM

## 2022-12-05 DIAGNOSIS — Z87891 Personal history of nicotine dependence: Secondary | ICD-10-CM | POA: Diagnosis not present

## 2022-12-05 DIAGNOSIS — I2510 Atherosclerotic heart disease of native coronary artery without angina pectoris: Secondary | ICD-10-CM | POA: Diagnosis not present

## 2022-12-05 DIAGNOSIS — L821 Other seborrheic keratosis: Secondary | ICD-10-CM | POA: Diagnosis not present

## 2022-12-05 DIAGNOSIS — D492 Neoplasm of unspecified behavior of bone, soft tissue, and skin: Secondary | ICD-10-CM | POA: Diagnosis not present

## 2022-12-05 DIAGNOSIS — I10 Essential (primary) hypertension: Secondary | ICD-10-CM | POA: Diagnosis not present

## 2023-03-19 ENCOUNTER — Other Ambulatory Visit: Payer: BC Managed Care – PPO

## 2023-03-19 ENCOUNTER — Ambulatory Visit (HOSPITAL_COMMUNITY): Payer: BC Managed Care – PPO | Attending: Cardiology

## 2023-03-19 DIAGNOSIS — I2510 Atherosclerotic heart disease of native coronary artery without angina pectoris: Secondary | ICD-10-CM | POA: Insufficient documentation

## 2023-03-19 DIAGNOSIS — I493 Ventricular premature depolarization: Secondary | ICD-10-CM | POA: Insufficient documentation

## 2023-03-19 LAB — ECHOCARDIOGRAM COMPLETE: S' Lateral: 3.7 cm

## 2023-03-20 NOTE — Progress Notes (Signed)
 Very stable echocardiogram with mildly reduced LV systolic function.  Will discuss more on office visit.  If he has an appointment coming up soon, no reason to call.

## 2023-03-22 ENCOUNTER — Other Ambulatory Visit: Payer: Self-pay | Admitting: Cardiology

## 2023-03-22 DIAGNOSIS — I5022 Chronic systolic (congestive) heart failure: Secondary | ICD-10-CM

## 2023-03-24 ENCOUNTER — Other Ambulatory Visit: Payer: Self-pay | Admitting: Cardiology

## 2023-03-27 ENCOUNTER — Telehealth: Payer: Self-pay | Admitting: Cardiology

## 2023-03-27 NOTE — Telephone Encounter (Signed)
Attempted to contact [facility] to receive clarification as to what procedure/how many teeth will be extracted as it is not on the received preop clearance request. [id number]

## 2023-03-27 NOTE — Telephone Encounter (Signed)
I am faxing the patient's pre-op paper from his dentist's office - Patient would like to be contacted on the progress of getting this Pre-op document back to the dentist as he needs to get his tooth pulled ASAP.  Thank you, in advance. -Kurtis

## 2023-03-27 NOTE — Telephone Encounter (Signed)
We will keep an eye out for pre op clearance [place]. Pt does have appt as well with Dr. Jacinto Halim 04/07/23.

## 2023-03-30 NOTE — Telephone Encounter (Signed)
Not okay to stop ASA. Clear for surgery but to continue ASA 81 mg daily.

## 2023-03-30 NOTE — Telephone Encounter (Signed)
   Patient Name: Jermaine Morris  DOB: May 02, 1960 MRN: [id number]  Primary Cardiologist: Yates Decamp, MD  Chart reviewed as part of pre-operative protocol coverage. Given past medical history and time since last visit, based on ACC/AHA guidelines, Jermaine Morris is at acceptable risk for the planned procedure without further cardiovascular testing.   Per Dr. Jacinto Halim: "Not okay to stop ASA. Clear for surgery but to continue ASA 81 mg daily."  I will route this recommendation to the requesting party via Epic fax function and remove from pre-op pool.  Please call with questions.  Denyce Robert, NP 03/30/2023, 2:21 PM

## 2023-03-30 NOTE — Telephone Encounter (Signed)
Tried to call [facility] to confirm how many teeth are being extracted.       Pre-operative Risk Assessment    Patient Name: Jermaine Morris  DOB: 07-14-1960 MRN: [id number]   Date of last office visit: 09/23/22 DR. Jacinto Halim Date of next office visit: 04/07/23 DR. GANJI-6 MONTH F/U  Request for Surgical Clearance    Procedure:  Dental Extraction - Amount of Teeth to be Pulled:  1 SURGICAL EXTRACTION WITH BONE GRAFT  Date of Surgery:  Clearance 04/10/23                                Surgeon:  DR. Desma Maxim, DMD Surgeon's Group or Practice Name:  [facility]  Phone number:  I WAS TRANSFERRED TO THE [place] LOCATION Fax number:  [phone]   Type of Clearance Requested:   - Medical  - Pharmacy:  Hold Aspirin     Type of Anesthesia:  Local     Additional requests/questions:    [name]   03/30/2023, 11:18 AM

## 2023-04-02 ENCOUNTER — Ambulatory Visit: Payer: Self-pay | Admitting: Cardiology

## 2023-04-05 DIAGNOSIS — Z23 Encounter for immunization: Secondary | ICD-10-CM | POA: Diagnosis not present

## 2023-04-05 DIAGNOSIS — W2203XA Walked into furniture, initial encounter: Secondary | ICD-10-CM | POA: Diagnosis not present

## 2023-04-05 DIAGNOSIS — S0181XA Laceration without foreign body of other part of head, initial encounter: Secondary | ICD-10-CM | POA: Diagnosis not present

## 2023-04-05 NOTE — Progress Notes (Unsigned)
Cardiology Office Note:  .   Date:  04/07/2023  ID:  Jermaine Morris, DOB 03/27/1960, MRN [id number] PCP: Patient, No Pcp Per  [facility] Providers Cardiologist:  Yates Decamp, MD   History of Present Illness: .   Jermaine Morris is a 63 y.o. Caucasian male patient with COPD, GERD, tobacco use disorder who had quit smoking in September 2021 but has started smoking again in July 2024 briefly and has remained abstinent, hyperlipidemia, ischemic cardiomyopathy with severe LV systolic dysfunction of 15 to 91% in 2022 now improved to low normal by echo in Jan 2024, frequent PVCs resolved on beta blocker therapy, CAD with LAD stenting on 05/31/2020. Presently doing well.   Discussed the use of AI scribe software for clinical note transcription with the patient, who gave verbal consent to proceed.  History of Present Illness   The patient, with a history of heart disease and COPD, presents for a follow-up visit. He reports adherence to his medication regimen, with occasional forgetfulness in taking his evening doses of clorhidro and indrestrol. He has successfully quit smoking and has not used any nicotine products for the past six months. He denies any chest pain or significant shortness of breath. He also reports a recent fall while moving furniture, resulting in a significant cut that required sutures at an Urgent Care facility. The patient also mentions a scheduled dental procedure to extract a tooth. He has recently started taking a multivitamin (Centrum Silver) daily. He also requests a prescription for Cialis.     Labs   Lab Results  Component Value Date   CHOL 142 03/28/2022   HDL 68 03/28/2022   LDLCALC 58 03/28/2022   LDLDIRECT 70 03/22/2021   TRIG 84 03/28/2022   Lab Results  Component Value Date   NA 139 03/28/2022   K 4.6 03/28/2022   CO2 26 03/28/2022   GLUCOSE 129 (H) 03/28/2022   BUN 11 03/28/2022   CREATININE 0.97 03/28/2022   CALCIUM 9.4 03/28/2022   EGFR 89 03/28/2022    GFRNONAA >60 06/01/2020      Latest Ref Rng & Units 03/28/2022   10:45 AM 03/22/2021   10:11 AM 06/01/2020    3:11 AM  BMP  Glucose 70 - 99 mg/dL 478  93  295   BUN 8 - 27 mg/dL 11  14  23    Creatinine 0.76 - 1.27 mg/dL 6.21  3.08  6.57   BUN/Creat Ratio 10 - 24 11  14     Sodium 134 - 144 mmol/L 139  139  131   Potassium 3.5 - 5.2 mmol/L 4.6  4.7  3.4   Chloride 96 - 106 mmol/L 98  99  97   CO2 20 - 29 mmol/L 26  25  26    Calcium 8.6 - 10.2 mg/dL 9.4  9.4  8.4       Latest Ref Rng & Units 03/28/2022   10:45 AM 06/01/2020    3:11 AM 05/30/2020    3:37 AM  CBC  WBC 3.4 - 10.8 x10E3/uL 6.9  12.4  10.9   Hemoglobin 13.0 - 17.7 g/dL 84.6  96.2  95.2   Hematocrit 37.5 - 51.0 % 47.2  47.6  46.9   Platelets 150 - 450 x10E3/uL 247  292  254    Review of Systems  Cardiovascular:  Positive for dyspnea on exertion (mild). Negative for chest pain and leg swelling.   Physical Exam:   VS:  BP 124/78 (BP Location: Right Arm, Patient  Position: Sitting, Cuff Size: Normal)   Pulse 63   Resp 16   Ht 6' (1.829 m)   Wt 165 lb 6.4 oz (75 kg)   SpO2 96%   BMI 22.43 kg/m    Wt Readings from Last 3 Encounters:  04/07/23 165 lb 6.4 oz (75 kg)  09/23/22 161 lb (73 kg)  06/19/22 156 lb (70.8 kg)   Physical Exam Neck:     Vascular: No carotid bruit or JVD.  Cardiovascular:     Rate and Rhythm: Normal rate and regular rhythm.     Pulses: Intact distal pulses.     Heart sounds: Normal heart sounds. No murmur heard.    No gallop.  Pulmonary:     Effort: Pulmonary effort is normal.     Breath sounds: Normal breath sounds.  Abdominal:     General: Bowel sounds are normal.     Palpations: Abdomen is soft.  Musculoskeletal:     Right lower leg: No edema.     Left lower leg: No edema.    Studies Reviewed: Marland Kitchen    Coronary angioplasty 05/31/2020:  3.5 x 18 mm resolute DES, due to edge dissection a 3.0 x 12 mm resolute Onyx DES was deployed.      Echocardiogram 03/21/2022: Low normal LV  systolic function with visual EF 50-55%. Left ventricle cavity is normal in size. Normal left ventricular wall thickness. Abnormal septal wall motion due to left bundle branch block. Normal diastolic filling pattern, normal LAP. No significant valvular heart disease. Compared to 03/14/2021 LVEF improved from 25-30% to 50-55%, G1DD is now normal, otherwise no significant change.   EKG:    EKG Interpretation Date/Time:  Tuesday April 07 2023 09:41:35 EST Ventricular Rate:  61 PR Interval:  156 QRS Duration:  106 QT Interval:  438 QTC Calculation: 440 R Axis:   66  Text Interpretation: EKG 04/07/2023: Normal sinus rhythm at rate of 61 bpm, normal axis, poor R progression, cannot exclude anteroseptal infarct 4.  No evidence of ischemia compared to 03/27/2022, biatrial enlargement not evident. Confirmed by Delrae Rend [phone]) on 04/07/2023 9:44:25 AM    EKG 03/27/2022: Normal sinus rhythm with rate of 85 bpm, biatrial enlargement, normal axis. Poor R progression, cannot exclude anteroseptal infarct old.   Medications and allergies    Allergies  Allergen Reactions   Codeine Nausea And Vomiting     Current Outpatient Medications:    aspirin (ASPIRIN CHILDRENS) 81 MG chewable tablet, Chew 1 tablet (81 mg total) by mouth daily., Disp: , Rfl:    carvedilol (COREG) 12.5 MG tablet, TAKE 1 TABLET (12.5 MG TOTAL) BY MOUTH 2 (TWO) TIMES DAILY WITH A MEAL. 2 TABLETS TWICE A DAY, Disp: 180 tablet, Rfl: 3   ENTRESTO 24-26 MG, TAKE 1 TABLET BY MOUTH TWICE A DAY, Disp: 180 tablet, Rfl: 1   famotidine (PEPCID) 20 MG tablet, Take 20 mg by mouth 2 (two) times daily., Disp: , Rfl:    nitroGLYCERIN (NITROSTAT) 0.4 MG SL tablet, Place 1 tablet (0.4 mg total) under the tongue every 5 (five) minutes as needed for chest pain., Disp: 30 tablet, Rfl: 3   pyridOXINE (VITAMIN B6) 50 MG tablet, TAKE 1 TABLET BY MOUTH DAILY AFTER BREAKFAST, Disp: 100 tablet, Rfl: 2   rosuvastatin (CRESTOR) 20 MG tablet, TAKE 1  TABLET BY MOUTH EVERY DAY, Disp: 90 tablet, Rfl: 3   spironolactone (ALDACTONE) 25 MG tablet, TAKE 1/2 TABLET BY MOUTH DAILY, Disp: 45 tablet, Rfl: 4   tadalafil (CIALIS)  20 MG tablet, Take 1 tablet (20 mg total) by mouth daily as needed for erectile dysfunction., Disp: 10 tablet, Rfl: 6   ASSESSMENT AND PLAN: .      ICD-10-CM   1. Coronary artery disease involving native coronary artery of native heart without angina pectoris  I25.10 EKG 12-Lead    Lipid Panel With LDL/HDL Ratio    Lipoprotein A (LPA)    CMP14+EGFR    TSH    CBC    2. Primary hypertension  I10     3. Hypercholesteremia  E78.00     4. Drug-induced erectile dysfunction  N52.2 tadalafil (CIALIS) 20 MG tablet    5. Screening for prostate cancer  Z12.5 PSA     1. Coronary artery disease involving native coronary artery of native heart without angina pectoris (Primary) Patient with no recurrence of angina pectoris, essentially remains asymptomatic except for mild chronic dyspnea from COPD. - EKG 12-Lead - Lipid Panel With LDL/HDL Ratio - Lipoprotein A (LPA) - CMP14+EGFR - TSH - CBC  2. Primary hypertension Blood pressure is under excellent control, presently on carvedilol, spironolactone and Entresto.  Labs reviewed, renal function is normal and lipids previously well-controlled.  He needs routine labs as dictated above.  3. Hypercholesteremia Lipids previously well-controlled, goal LDL  4. Drug-induced erectile dysfunction Patient requests prescription for Cialis, I do not see any contraindication.  He is aware of interaction with nitroglycerin. - tadalafil (CIALIS) 20 MG tablet; Take 1 tablet (20 mg total) by mouth daily as needed for erectile dysfunction.  Dispense: 10 tablet; Refill: 6  5. Screening for prostate cancer Patient does not have a PCP.  He is not interested in establishing with a PCP.  He is presently 63 years of age, well obtain PSA for screening, will increase him to establish with a PCP.   Otherwise I will see him back in a year. - PSA  Signed,  Yates Decamp, MD, [facility] 04/07/2023, 2:06 PM [facility] [address] [place], [place] [address] Phone: [phone]. Fax:  [phone]

## 2023-04-07 ENCOUNTER — Ambulatory Visit: Payer: BC Managed Care – PPO | Attending: Cardiology | Admitting: Cardiology

## 2023-04-07 ENCOUNTER — Encounter: Payer: Self-pay | Admitting: Cardiology

## 2023-04-07 ENCOUNTER — Other Ambulatory Visit: Payer: Self-pay | Admitting: Cardiology

## 2023-04-07 VITALS — BP 124/78 | HR 63 | Resp 16 | Ht 72.0 in | Wt 165.4 lb

## 2023-04-07 DIAGNOSIS — I10 Essential (primary) hypertension: Secondary | ICD-10-CM

## 2023-04-07 DIAGNOSIS — Z125 Encounter for screening for malignant neoplasm of prostate: Secondary | ICD-10-CM

## 2023-04-07 DIAGNOSIS — I2510 Atherosclerotic heart disease of native coronary artery without angina pectoris: Secondary | ICD-10-CM

## 2023-04-07 DIAGNOSIS — N522 Drug-induced erectile dysfunction: Secondary | ICD-10-CM | POA: Diagnosis not present

## 2023-04-07 DIAGNOSIS — E7800 Pure hypercholesterolemia, unspecified: Secondary | ICD-10-CM

## 2023-04-07 DIAGNOSIS — I493 Ventricular premature depolarization: Secondary | ICD-10-CM

## 2023-04-07 MED ORDER — TADALAFIL 20 MG PO TABS: 20.0000 mg | ORAL_TABLET | Freq: Every day | ORAL | 6 refills | Status: AC | PRN

## 2023-04-07 NOTE — Patient Instructions (Signed)
Medication Instructions:  Your physician recommends that you continue on your current medications as directed. Please refer to the Current Medication list given to you today.  *If you need a refill on your cardiac medications before your next appointment, please call your pharmacy*   Lab Work: Have lab work done at [facility] on the first floor today If you have labs (blood work) drawn today and your tests are completely normal, you will receive your results only by: [employer] (if you have MyChart) OR A paper copy in the mail If you have any lab test that is abnormal or we need to change your treatment, we will call you to review the results.   Testing/Procedures: none   Follow-Up: At [facility], you and your health needs are our priority.  As part of our continuing mission to provide you with exceptional heart care, we have created designated Provider Care Teams.  These Care Teams include your primary Cardiologist (physician) and Advanced Practice Providers (APPs -  Physician Assistants and Nurse Practitioners) who all work together to provide you with the care you need, when you need it.  We recommend signing up for the patient portal called "MyChart".  Sign up information is provided on this After Visit Summary.  MyChart is used to connect with patients for Virtual Visits (Telemedicine).  Patients are able to view lab/test results, encounter notes, upcoming appointments, etc.  Non-urgent messages can be sent to your provider as well.   To learn more about what you can do with MyChart, go to [email or web address].    Your next appointment:   12 month(s)  Provider:   Yates Decamp, MD     Other Instructions

## 2023-04-08 LAB — CMP14+EGFR
ALT: 19 [IU]/L (ref 0–44)
AST: 22 [IU]/L (ref 0–40)
Albumin: 3.8 g/dL — ABNORMAL LOW (ref 3.9–4.9)
Alkaline Phosphatase: 114 [IU]/L (ref 44–121)
BUN/Creatinine Ratio: 19 (ref 10–24)
BUN: 16 mg/dL (ref 8–27)
Bilirubin Total: 0.5 mg/dL (ref 0.0–1.2)
CO2: 25 mmol/L (ref 20–29)
Calcium: 8.9 mg/dL (ref 8.6–10.2)
Chloride: 99 mmol/L (ref 96–106)
Creatinine, Ser: 0.85 mg/dL (ref 0.76–1.27)
Globulin, Total: 2.3 g/dL (ref 1.5–4.5)
Glucose: 99 mg/dL (ref 70–99)
Potassium: 4.4 mmol/L (ref 3.5–5.2)
Sodium: 137 mmol/L (ref 134–144)
Total Protein: 6.1 g/dL (ref 6.0–8.5)
eGFR: 98 mL/min/{1.73_m2} (ref >59)

## 2023-04-08 LAB — CBC
Hematocrit: 44.5 % (ref 37.5–51.0)
Hemoglobin: 15 g/dL (ref 13.0–17.7)
MCH: 34.9 pg — ABNORMAL HIGH (ref 26.6–33.0)
MCHC: 33.7 g/dL (ref 31.5–35.7)
MCV: 104 fL — ABNORMAL HIGH (ref 79–97)
Platelets: 325 10*3/uL (ref 150–450)
RBC: 4.3 x10E6/uL (ref 4.14–5.80)
RDW: 11.7 % (ref 11.6–15.4)
WBC: 8.2 10*3/uL (ref 3.4–10.8)

## 2023-04-08 LAB — LIPID PANEL WITH LDL/HDL RATIO
Cholesterol, Total: 161 mg/dL (ref 100–199)
HDL: 69 mg/dL (ref >39)
LDL Chol Calc (NIH): 78 mg/dL (ref 0–99)
LDL/HDL Ratio: 1.1 {ratio} (ref 0.0–3.6)
Triglycerides: 74 mg/dL (ref 0–149)
VLDL Cholesterol Cal: 14 mg/dL (ref 5–40)

## 2023-04-08 LAB — TSH: TSH: 1.32 u[IU]/mL (ref 0.450–4.500)

## 2023-04-08 LAB — LIPOPROTEIN A (LPA): Lipoprotein (a): 62.4 nmol/L (ref <75.0)

## 2023-04-08 LAB — PSA: Prostate Specific Ag, Serum: 0.7 ng/mL (ref 0.0–4.0)

## 2023-04-09 NOTE — Progress Notes (Signed)
All the labs including cholesterol is normal. PSA normal. See if we can refer him to PCP in [place], [place]

## 2023-04-13 ENCOUNTER — Other Ambulatory Visit: Payer: Self-pay

## 2023-04-13 DIAGNOSIS — E7800 Pure hypercholesterolemia, unspecified: Secondary | ICD-10-CM

## 2023-04-13 MED ORDER — ROSUVASTATIN CALCIUM 20 MG PO TABS: 20.0000 mg | ORAL_TABLET | Freq: Every day | ORAL | 3 refills | Status: AC

## 2023-05-05 ENCOUNTER — Encounter: Payer: Self-pay | Admitting: *Deleted

## 2023-10-02 ENCOUNTER — Other Ambulatory Visit: Payer: Self-pay | Admitting: Cardiology

## 2023-10-02 DIAGNOSIS — I5022 Chronic systolic (congestive) heart failure: Secondary | ICD-10-CM

## 2023-11-11 ENCOUNTER — Other Ambulatory Visit: Payer: Self-pay

## 2023-11-11 MED ORDER — NITROGLYCERIN 0.4 MG SL SUBL: 0.4000 mg | SUBLINGUAL_TABLET | SUBLINGUAL | 3 refills | Status: AC | PRN

## 2024-03-22 ENCOUNTER — Emergency Department (HOSPITAL_COMMUNITY)

## 2024-03-22 ENCOUNTER — Emergency Department (HOSPITAL_COMMUNITY)
Admission: EM | Admit: 2024-03-22 | Discharge: 2024-03-22 | Disposition: A | Discharge status: Home / Self Care | Attending: Emergency Medicine | Admitting: Emergency Medicine

## 2024-03-22 ENCOUNTER — Other Ambulatory Visit: Payer: Self-pay

## 2024-03-22 ENCOUNTER — Encounter (HOSPITAL_COMMUNITY): Payer: Self-pay

## 2024-03-22 DIAGNOSIS — I509 Heart failure, unspecified: Secondary | ICD-10-CM | POA: Diagnosis not present

## 2024-03-22 DIAGNOSIS — J441 Chronic obstructive pulmonary disease with (acute) exacerbation: Secondary | ICD-10-CM | POA: Insufficient documentation

## 2024-03-22 DIAGNOSIS — R0602 Shortness of breath: Secondary | ICD-10-CM

## 2024-03-22 DIAGNOSIS — Z87891 Personal history of nicotine dependence: Secondary | ICD-10-CM | POA: Diagnosis not present

## 2024-03-22 DIAGNOSIS — I2510 Atherosclerotic heart disease of native coronary artery without angina pectoris: Secondary | ICD-10-CM | POA: Insufficient documentation

## 2024-03-22 DIAGNOSIS — Z7982 Long term (current) use of aspirin: Secondary | ICD-10-CM | POA: Insufficient documentation

## 2024-03-22 LAB — COMPREHENSIVE METABOLIC PANEL WITH GFR
ALT: 18 U/L (ref 0–44)
AST: 27 U/L (ref 15–41)
Albumin: 4.1 g/dL (ref 3.5–5.0)
Alkaline Phosphatase: 114 U/L (ref 38–126)
Anion gap: 9 (ref 5–15)
BUN: 15 mg/dL (ref 8–23)
CO2: 29 mmol/L (ref 22–32)
Calcium: 8.9 mg/dL (ref 8.9–10.3)
Chloride: 94 mmol/L — ABNORMAL LOW (ref 98–111)
Creatinine, Ser: 0.84 mg/dL (ref 0.61–1.24)
GFR, Estimated: >60 mL/min
Glucose, Bld: 110 mg/dL — ABNORMAL HIGH (ref 70–99)
Potassium: 4.6 mmol/L (ref 3.5–5.1)
Sodium: 132 mmol/L — ABNORMAL LOW (ref 135–145)
Total Bilirubin: 0.4 mg/dL (ref 0.0–1.2)
Total Protein: 7.1 g/dL (ref 6.5–8.1)

## 2024-03-22 LAB — CBC WITH DIFFERENTIAL/PLATELET
Basophils Absolute: 0 K/uL (ref 0.0–0.1)
Basophils Relative: 0 %
Eosinophils Absolute: 0.1 K/uL (ref 0.0–0.5)
Eosinophils Relative: 3 %
HCT: 47.7 % (ref 39.0–52.0)
Hemoglobin: 16.8 g/dL (ref 13.0–17.0)
Lymphocytes Relative: 41 %
Lymphs Abs: 2 K/uL (ref 0.7–4.0)
MCH: 35 pg — ABNORMAL HIGH (ref 26.0–34.0)
MCHC: 35.2 g/dL (ref 30.0–36.0)
MCV: 99.4 fL (ref 80.0–100.0)
Monocytes Absolute: 0.6 K/uL (ref 0.1–1.0)
Monocytes Relative: 12 %
Neutro Abs: 2.2 K/uL (ref 1.7–7.7)
Neutrophils Relative %: 44 %
Platelets: 241 K/uL (ref 150–400)
RBC: 4.8 MIL/uL (ref 4.22–5.81)
RDW: 11.7 % (ref 11.5–15.5)
WBC: 4.9 K/uL (ref 4.0–10.5)
nRBC: 0 % (ref 0.0–0.2)

## 2024-03-22 LAB — TROPONIN T, HIGH SENSITIVITY
Troponin T High Sensitivity: <15 ng/L (ref 0–19)
Troponin T High Sensitivity: <15 ng/L (ref 0–19)

## 2024-03-22 LAB — PRO BRAIN NATRIURETIC PEPTIDE: Pro Brain Natriuretic Peptide: 128 pg/mL

## 2024-03-22 MED ORDER — PREDNISONE 20 MG PO TABS: 40.0000 mg | ORAL_TABLET | Freq: Every day | ORAL | 0 refills | Status: AC

## 2024-03-22 MED ORDER — PREDNISONE 20 MG PO TABS
60.0000 mg | ORAL_TABLET | Freq: Once | ORAL | Status: AC
  Administered 2024-03-22: 60 mg via ORAL
  Filled 2024-03-22: qty 3

## 2024-03-22 MED ORDER — AZITHROMYCIN 500 MG PO TABS: 500.0000 mg | ORAL_TABLET | Freq: Every day | ORAL | 0 refills | Status: AC

## 2024-03-22 MED ORDER — ALBUTEROL SULFATE HFA 108 (90 BASE) MCG/ACT IN AERS: 1.0000 | INHALATION_SPRAY | Freq: Four times a day (QID) | RESPIRATORY_TRACT | 0 refills | Status: AC | PRN

## 2024-03-22 NOTE — ED Notes (Signed)
 Pt gives verbal consent for mse

## 2024-03-22 NOTE — Discharge Instructions (Addendum)
 Your symptoms today are likely due to untreated COPD.  I have provided you with a referral to [facility] pulmonology, their office should be in contact with you to schedule a follow-up however if you do not hear from them please contact them at the number provided above to schedule an appointment.   You were given the first dose of a steroid in the emergency department today, you will start 40 mg of prednisone  tomorrow and take 2 tablets daily for 5 days.  Start azithromycin  and take 1 tablet by mouth daily for 5 days.  Start albuterol  and inhaler 1 to 2 puffs every 6 hours as needed for wheezing or shortness of breath. Return to the emergency department if your symptoms worsen.

## 2024-03-22 NOTE — ED Provider Triage Note (Signed)
 Emergency Medicine Provider Triage Evaluation Note  Jermaine Morris , a 64 y.o. male  was evaluated in triage.  Pt complains of shortness of breath and increasing cough/congestion for the past 2 to 3 days.  Went to urgent care and had negative COVID swab.  Told him to come to ER due to his heart history.  States he had some chest pain this morning as well.  Has been taking his medications as prescribed.  Denies blood in the sputum, fevers, headache, syncope.  Review of Systems  Positive: See above Negative: Headache, dizziness, syncope, abdominal pain, nausea/vomiting  Physical Exam  BP 109/83   Pulse (!) 40   Temp 97.7 F (36.5 C)   Resp 18   SpO2 95%  Gen:   Awake, no distress   Resp:  Normal effort  MSK:   Moves extremities without difficulty  Other:    Medical Decision Making  Medically screening exam initiated at 4:15 PM.  Appropriate orders placed.  Jermaine Morris was informed that the remainder of the evaluation will be completed by another provider, this initial triage assessment does not replace that evaluation, and the importance of remaining in the ED until their evaluation is complete.     Neysa Thersia RAMAN, [place] 03/22/24 1617

## 2024-03-22 NOTE — ED Triage Notes (Signed)
 Pt to er, pt states that he is here for weakness and sob since Sunday.  Pt states that he went to urgent care and they checked him for covid and it was negative, states that urgent care told him to come to the er. Denies chest pain, states that maybe a little bit of tightness.

## 2024-03-22 NOTE — ED Provider Notes (Signed)
 " [place] EMERGENCY DEPARTMENT AT [facility] Provider Note   CSN: [id number] Arrival date & time: 03/22/24  1539     Patient presents with: Weakness and Shortness of Breath   Jermaine Morris is a 64 y.o. male.   64 year old male presenting with multiple complaints.  Patient notes worsening shortness of breath has been ongoing since Sunday, he initially attributed this to chest congestion, for which he has been taking Mucinex, however he noticed over the past few days worsening shortness of breath at rest and with exertion, particularly while laying flat in the bed.  He also endorses a productive cough, no fever.  Since his symptoms have been ongoing, he has noticed feeling more weak and lightheaded at times, denies presyncope/syncope.  Denies any lower extremity edema.  Does endorse a tightness in his chest which again he attributed to chest congestion.  Patient smokes tobacco and does have a history of COPD, although it is unclear if he is currently taking any medications for management of this.  History of CAD and is followed by Dr. Ladona with cardiology.  Denies fever, nausea/vomiting/diarrhea/abdominal pain.  Had respiratory viral panel done at urgent care, he was told this was negative but was advised to come to the emergency department for further workup.   Weakness Associated symptoms: shortness of breath   Shortness of Breath      Prior to Admission medications  Medication Sig Start Date End Date Taking? Authorizing Provider  aspirin  (ASPIRIN  CHILDRENS) 81 MG chewable tablet Chew 1 tablet (81 mg total) by mouth daily. 04/13/20   Ladona Heinz, MD  carvedilol  (COREG ) 12.5 MG tablet TAKE 1 TABLET (12.5 MG TOTAL) BY MOUTH 2 (TWO) TIMES DAILY WITH A MEAL. 2 TABLETS TWICE A DAY 10/13/22   Ladona Heinz, MD  ENTRESTO  24-26 MG TAKE 1 TABLET BY MOUTH TWICE A DAY 10/02/23   Ladona Heinz, MD  famotidine  (PEPCID ) 20 MG tablet Take 20 mg by mouth 2 (two) times daily.    [provider]  nitroGLYCERIN  (NITROSTAT ) 0.4 MG SL tablet Place 1 tablet (0.4 mg total) under the tongue every 5 (five) minutes as needed for chest pain. 11/11/23 02/09/24  Ladona Heinz, MD  pyridOXINE (VITAMIN B6) 50 MG tablet TAKE 1 TABLET BY MOUTH DAILY AFTER BREAKFAST 09/19/22   Ladona Heinz, MD  rosuvastatin  (CRESTOR ) 20 MG tablet Take 1 tablet (20 mg total) by mouth daily. 04/13/23   Ladona Heinz, MD  spironolactone  (ALDACTONE ) 25 MG tablet TAKE 1/2 TABLET BY MOUTH DAILY 03/24/23   Ladona Heinz, MD  tadalafil  (CIALIS ) 20 MG tablet Take 1 tablet (20 mg total) by mouth daily as needed for erectile dysfunction. 04/07/23   Ladona Heinz, MD    Allergies: Codeine    Review of Systems  Respiratory:  Positive for shortness of breath.   Neurological:  Positive for weakness.    Updated Vital Signs  Vitals:   03/22/24 1851 03/22/24 1957 03/22/24 2000 03/22/24 2101  BP:   (!) 124/90   Pulse:   73 77  Resp:    19  Temp:  98.2 F (36.8 C)  98 F (36.7 C)  TempSrc:  Oral    SpO2:   97% 97%  Weight: 72.6 kg     Height: 5' 11 (1.803 m)        Physical Exam Vitals and nursing note reviewed.  Constitutional:      General: He is not in acute distress.    Appearance: Normal appearance. He is  not ill-appearing or toxic-appearing.  HENT:     Head: Normocephalic.  Eyes:     Extraocular Movements: Extraocular movements intact.     Pupils: Pupils are equal, round, and reactive to light.  Cardiovascular:     Rate and Rhythm: Normal rate and regular rhythm.     Heart sounds: Normal heart sounds.  Pulmonary:     Effort: Pulmonary effort is normal.     Breath sounds: No rhonchi or rales.     Comments: Faint expiratory wheeze Abdominal:     Palpations: Abdomen is soft.     Tenderness: There is no abdominal tenderness. There is no guarding.  Musculoskeletal:     Cervical back: Normal range of motion.     Right lower leg: No edema.     Left lower leg: No edema.     Comments: Moves all extremities  spontaneously without difficulty  Skin:    General: Skin is warm and dry.  Neurological:     General: No focal deficit present.     Mental Status: He is alert and oriented to person, place, and time.     (all labs ordered are listed, but only abnormal results are displayed) Labs Reviewed  COMPREHENSIVE METABOLIC PANEL WITH GFR - Abnormal; Notable for the following components:      Result Value   Sodium 132 (*)    Chloride 94 (*)    Glucose, Bld 110 (*)    All other components within normal limits  CBC WITH DIFFERENTIAL/PLATELET - Abnormal; Notable for the following components:   MCH 35.0 (*)    All other components within normal limits  PRO BRAIN NATRIURETIC PEPTIDE  TROPONIN T, HIGH SENSITIVITY  TROPONIN T, HIGH SENSITIVITY    EKG: EKG Interpretation Date/Time:  Tuesday March 22 2024 15:58:46 EST Ventricular Rate:  80 PR Interval:  154 QRS Duration:  92 QT Interval:  406 QTC Calculation: 468 R Axis:   80  Text Interpretation: Sinus rhythm with Premature supraventricular complexes and with frequent Premature ventricular complexes Right atrial enlargement When compared with ECG of 07-Apr-2023 09:41, PREVIOUS ECG IS PRESENT Confirmed by Cottie Cough [phone]) on 03/22/2024 4:54:22 PM  Radiology: [facility] Chest 1 View Result Date: 03/22/2024 CLINICAL DATA:  Shortness of breath. EXAM: CHEST  1 VIEW COMPARISON:  Chest radiograph dated 05/29/2020. FINDINGS: No focal consolidation, pleural effusion, or pneumothorax. The cardiac silhouette is within normal limits. No acute osseous pathology. IMPRESSION: No active disease. Electronically Signed   By: Vanetta Chou M.D.   On: 03/22/2024 17:36     Procedures   Medications Ordered in the ED - No data to display  Clinical Course as of 03/22/24 2234  Tue Mar 22, 2024  1654 HR 80's ventricular bigemeny [MT]    Clinical Course User Index [MT] Cottie Cough PARAS, MD                                 Medical Decision Making This  patient presents to the ED for concern of shortness of breath, this involves an extensive number of treatment options, and is a complaint that carries with it a high risk of complications and morbidity.  The differential diagnosis includes COPD exacerbation, CHF exacerbation, ACS, pneumonia   Co morbidities that complicate the patient evaluation  COPD, CHF, ischemic cardiomyopathy   Additional history obtained:  Additional history obtained from record review External records from outside source obtained and reviewed including prior  cardiology note   Lab Tests:  I Ordered, and personally interpreted labs.  The pertinent results include: CBC largely unremarkable, no leukocytosis. CMP with sodium 132. Initial troponin < 15, repeat remains unchanged. BNP 128.    Imaging Studies ordered:  I ordered imaging studies including CXR  I independently visualized and interpreted imaging which showed No active disease.  I agree with the radiologist interpretation   Problem List / ED Course / Critical interventions / Medication management  I ordered medication including prednisone  for suspected COPD exacerbation I have reviewed the patients home medicines and have made adjustments as needed   Social Determinants of Health:  Former tobacco use   Test / Admission - Considered:  Physical exam is largely unremarkable as above, patient does have a faint expiratory wheeze on exam but is not demonstrating signs of respiratory distress, he is able to speak in full/clear sentences without issue, maintaining his oxygen saturation at 97% on room air. Questionable history of COPD, patient is not on any inhalers for COPD but based on review of his prior cardiology note it appears he does have a history of this, he continues to smoke tobacco.  Patient admits he is not established with a primary care provider, he does see Dr. Ladona with cardiology but typically when he has a separate health issue he will  just go to urgent care for treatment. ACS workup is unremarkable as above, normal troponin x 2, reassuring chest x-ray. Patient's worsening shortness of breath seems to be consistent with COPD exacerbation, patient demonstrates a change in the nature of his cough recently and that it has been more productive with yellowish sputum, no fevers at home and no evidence of pneumonia/consolidation/infiltrate on chest x-ray today. Will administer 60 mg prednisone  in the emergency department today, will discharge with remaining 5-day course of prednisone  as well as azithromycin  and albuterol  inhaler for suspected COPD exacerbation.  I recommend the patient establish care with a pulmonologist, will place referral to [facility] pulmonology as patient needs a formal management of his COPD. Patient voiced understanding is in agreement this plan, return precautions discussed, he is appropriate for discharge at this time.  Staffed with Dr. Cottie   Risk Prescription drug management.        Final diagnoses:  Shortness of breath  COPD exacerbation [facility])    ED Discharge Orders          Ordered    azithromycin  (ZITHROMAX ) 500 MG tablet  Daily        03/22/24 2057    predniSONE  (DELTASONE ) 20 MG tablet  Daily        03/22/24 2057    albuterol  (VENTOLIN  HFA) 108 (90 Base) MCG/ACT inhaler  Every 6 hours PRN        03/22/24 2057    Ambulatory referral to Pulmonology       Comments: COPD   03/22/24 2057               Glendia Rocky SAILOR, PA-C 03/22/24 2237    Cottie Donnice PARAS, MD 03/22/24 2312  "
